# Patient Record
Sex: Male | Born: 1985 | Race: White | Hispanic: No | Marital: Married | Smoking: Never smoker
Health system: Southern US, Community
[De-identification: ages and names within clinical notes are randomized; demographics above are authoritative.]

## PROBLEM LIST (undated history)

## (undated) DIAGNOSIS — R42 Dizziness and giddiness: Secondary | ICD-10-CM

## (undated) DIAGNOSIS — F419 Anxiety disorder, unspecified: Secondary | ICD-10-CM

## (undated) DIAGNOSIS — E7800 Pure hypercholesterolemia, unspecified: Secondary | ICD-10-CM

## (undated) DIAGNOSIS — M549 Dorsalgia, unspecified: Secondary | ICD-10-CM

## (undated) DIAGNOSIS — K760 Fatty (change of) liver, not elsewhere classified: Secondary | ICD-10-CM

## (undated) HISTORY — PX: APPENDECTOMY: SHX54

---

## 2009-02-20 ENCOUNTER — Ambulatory Visit: Payer: Self-pay | Admitting: Family Medicine

## 2009-02-20 DIAGNOSIS — S335XXA Sprain of ligaments of lumbar spine, initial encounter: Secondary | ICD-10-CM

## 2009-02-20 DIAGNOSIS — M546 Pain in thoracic spine: Secondary | ICD-10-CM

## 2009-02-20 DIAGNOSIS — S20229A Contusion of unspecified back wall of thorax, initial encounter: Secondary | ICD-10-CM | POA: Insufficient documentation

## 2009-02-20 IMAGING — CR DG LUMBAR SPINE COMPLETE 4+V
7 series · 7 of 7 positions shown · non-contrast
Comparison: Thoracic spine films.

CLINICAL DATA: Status post fall.

LUMBAR SPINE - COMPLETE 4+ VIEW

[view not recorded (1 of 7)]
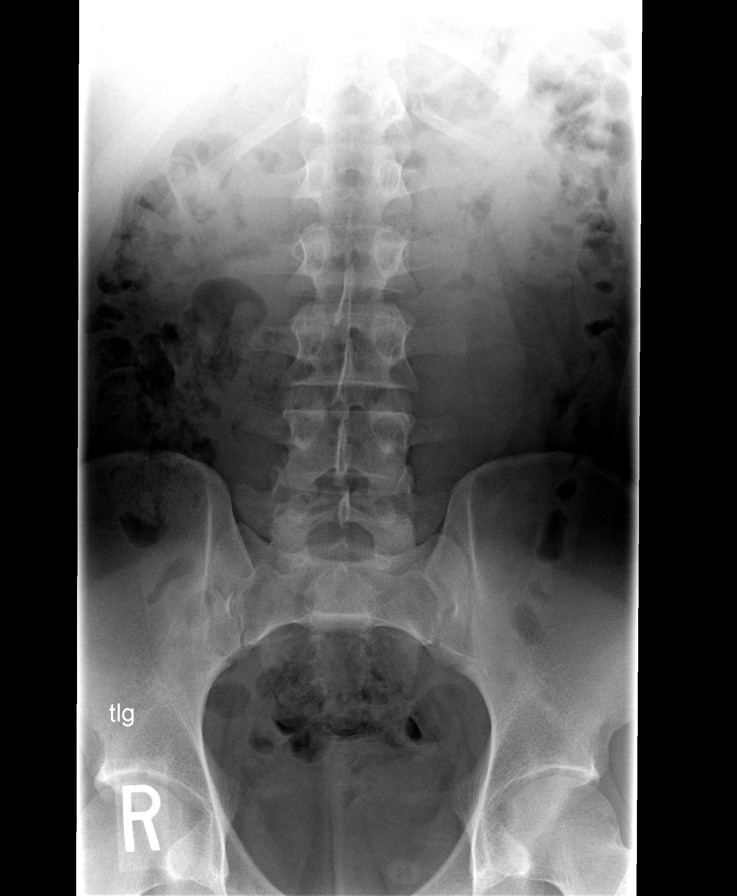

[view not recorded (2 of 7)]
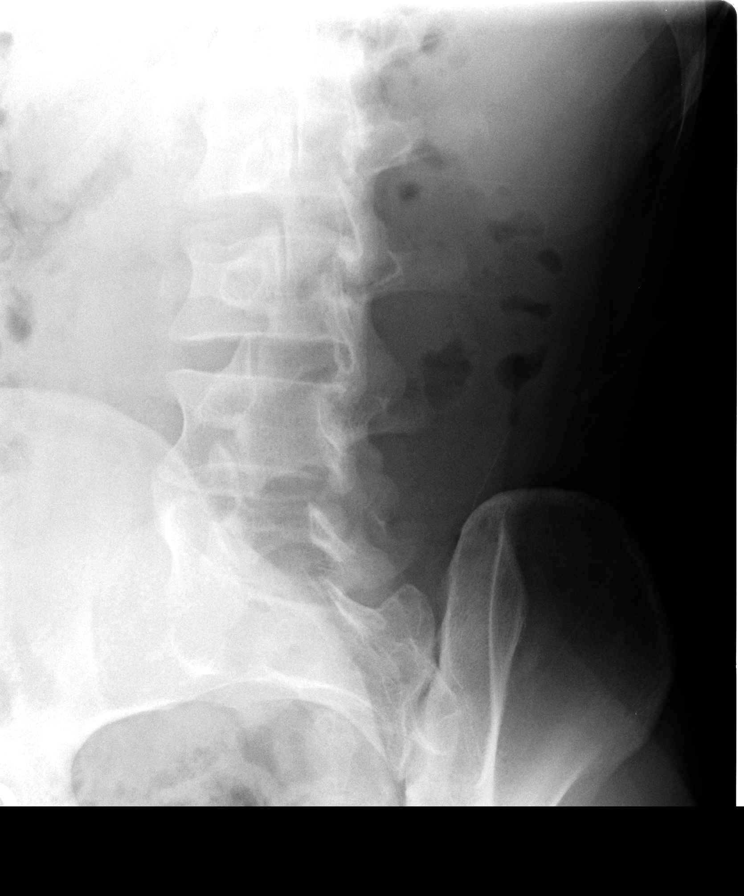

[view not recorded (3 of 7)]
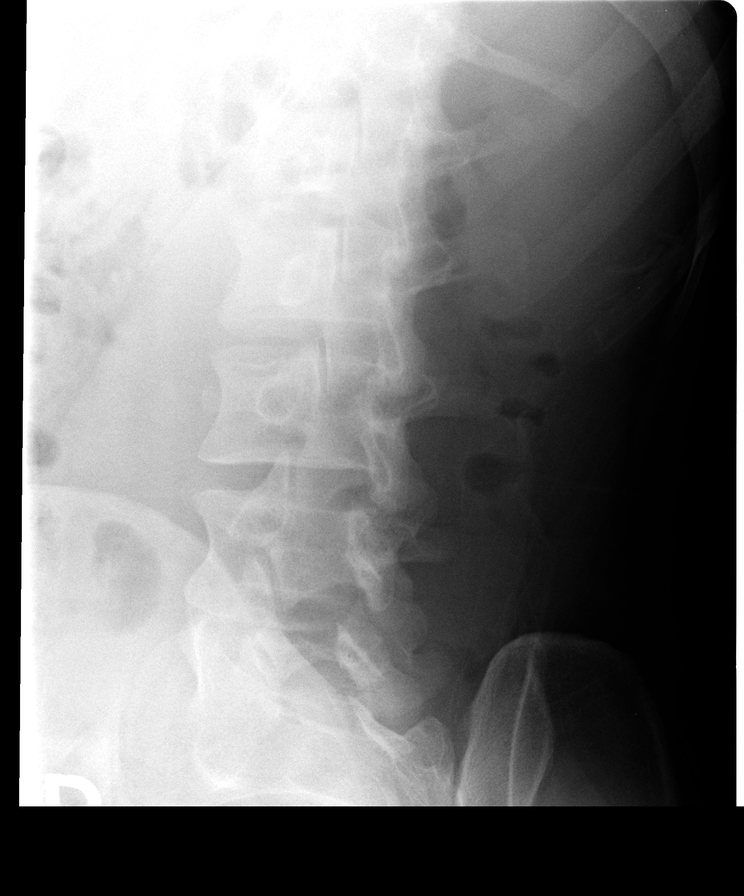

[view not recorded (4 of 7)]
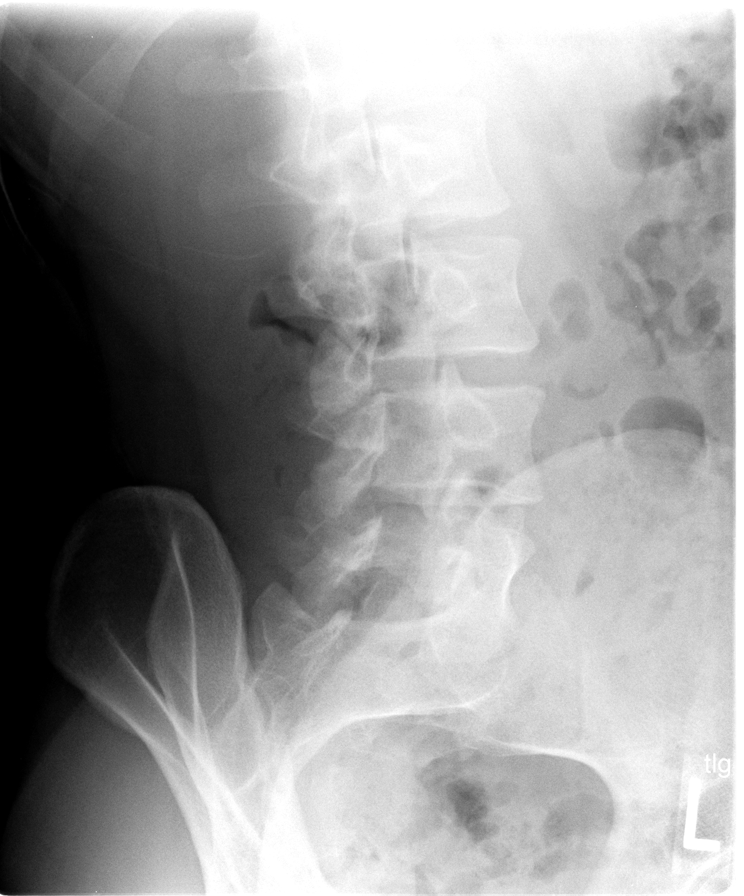

[view not recorded (5 of 7)]
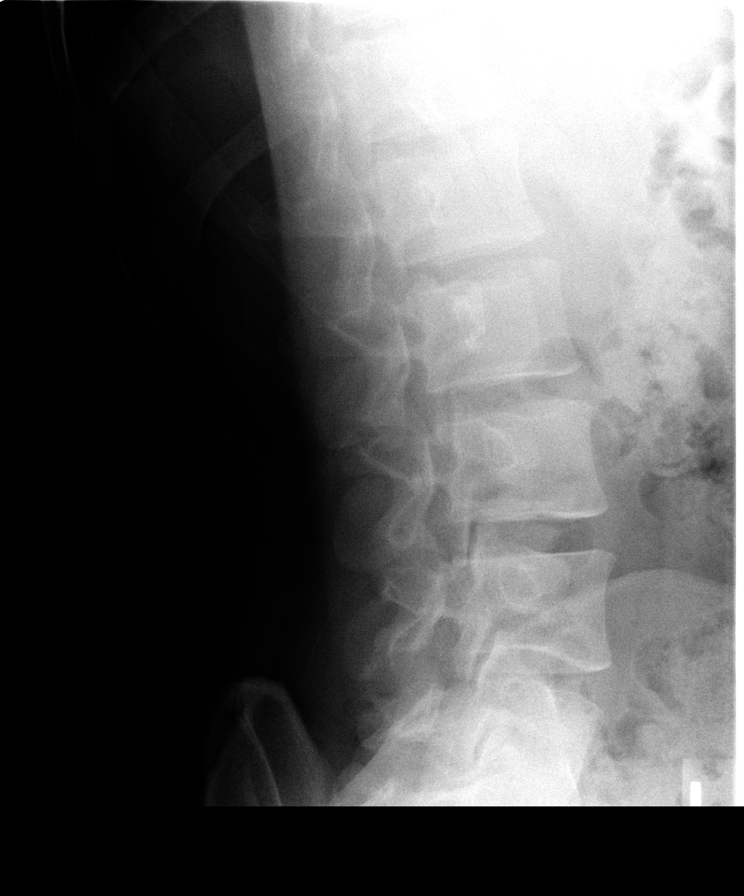

[view not recorded (6 of 7)]
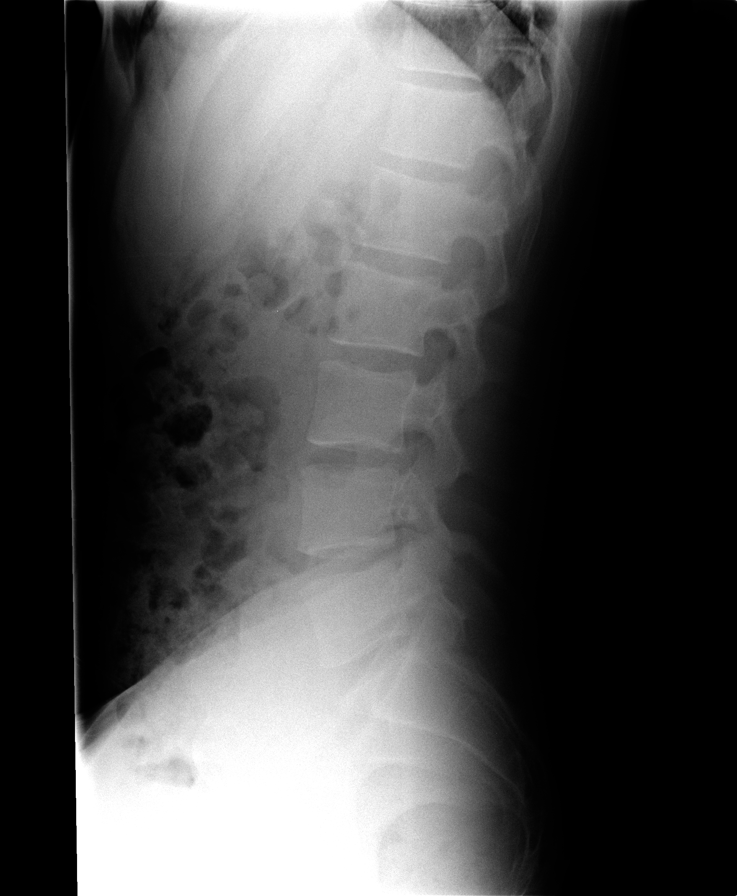

[view not recorded (7 of 7)]
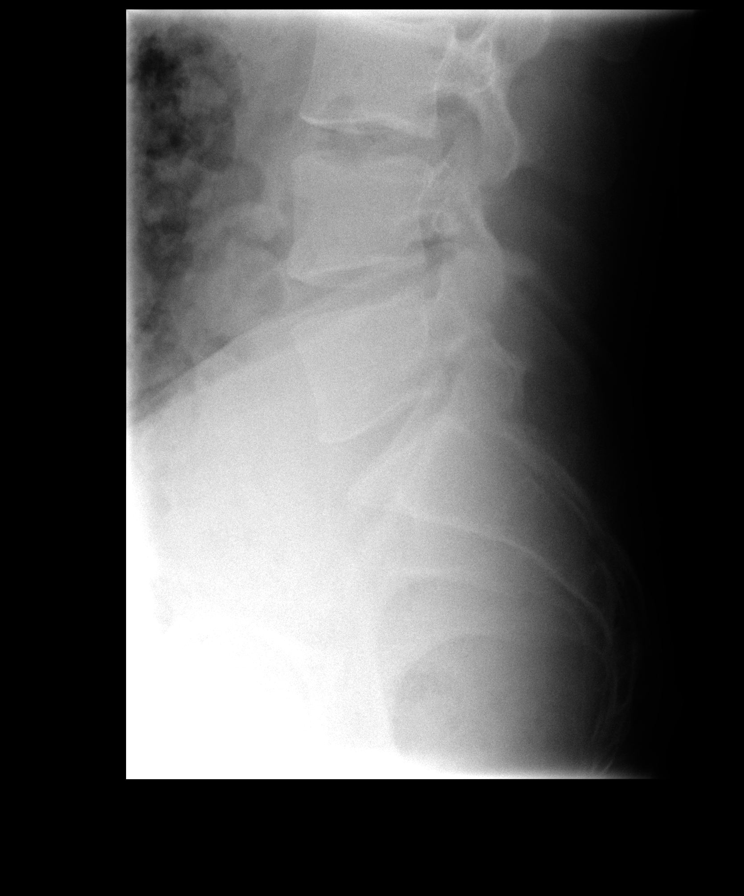

[7 of 7 positions shown; findings below may reference images not displayed]

FINDINGS: Five non-rib bearing lumbar type vertebral bodies are
present.  The vertebral body heights and alignment are maintained.
There is no evidence for acute fracture or traumatic subluxation.
IMPRESSION: Negative lumbar spine.

## 2009-02-20 IMAGING — CR DG THORACIC SPINE 2V
2 series · 2 of 2 positions shown · non-contrast
Comparison: None available.

CLINICAL DATA: Status post fall.  Back pain.

THORACIC SPINE - 2 VIEW

[view not recorded (1 of 2)]
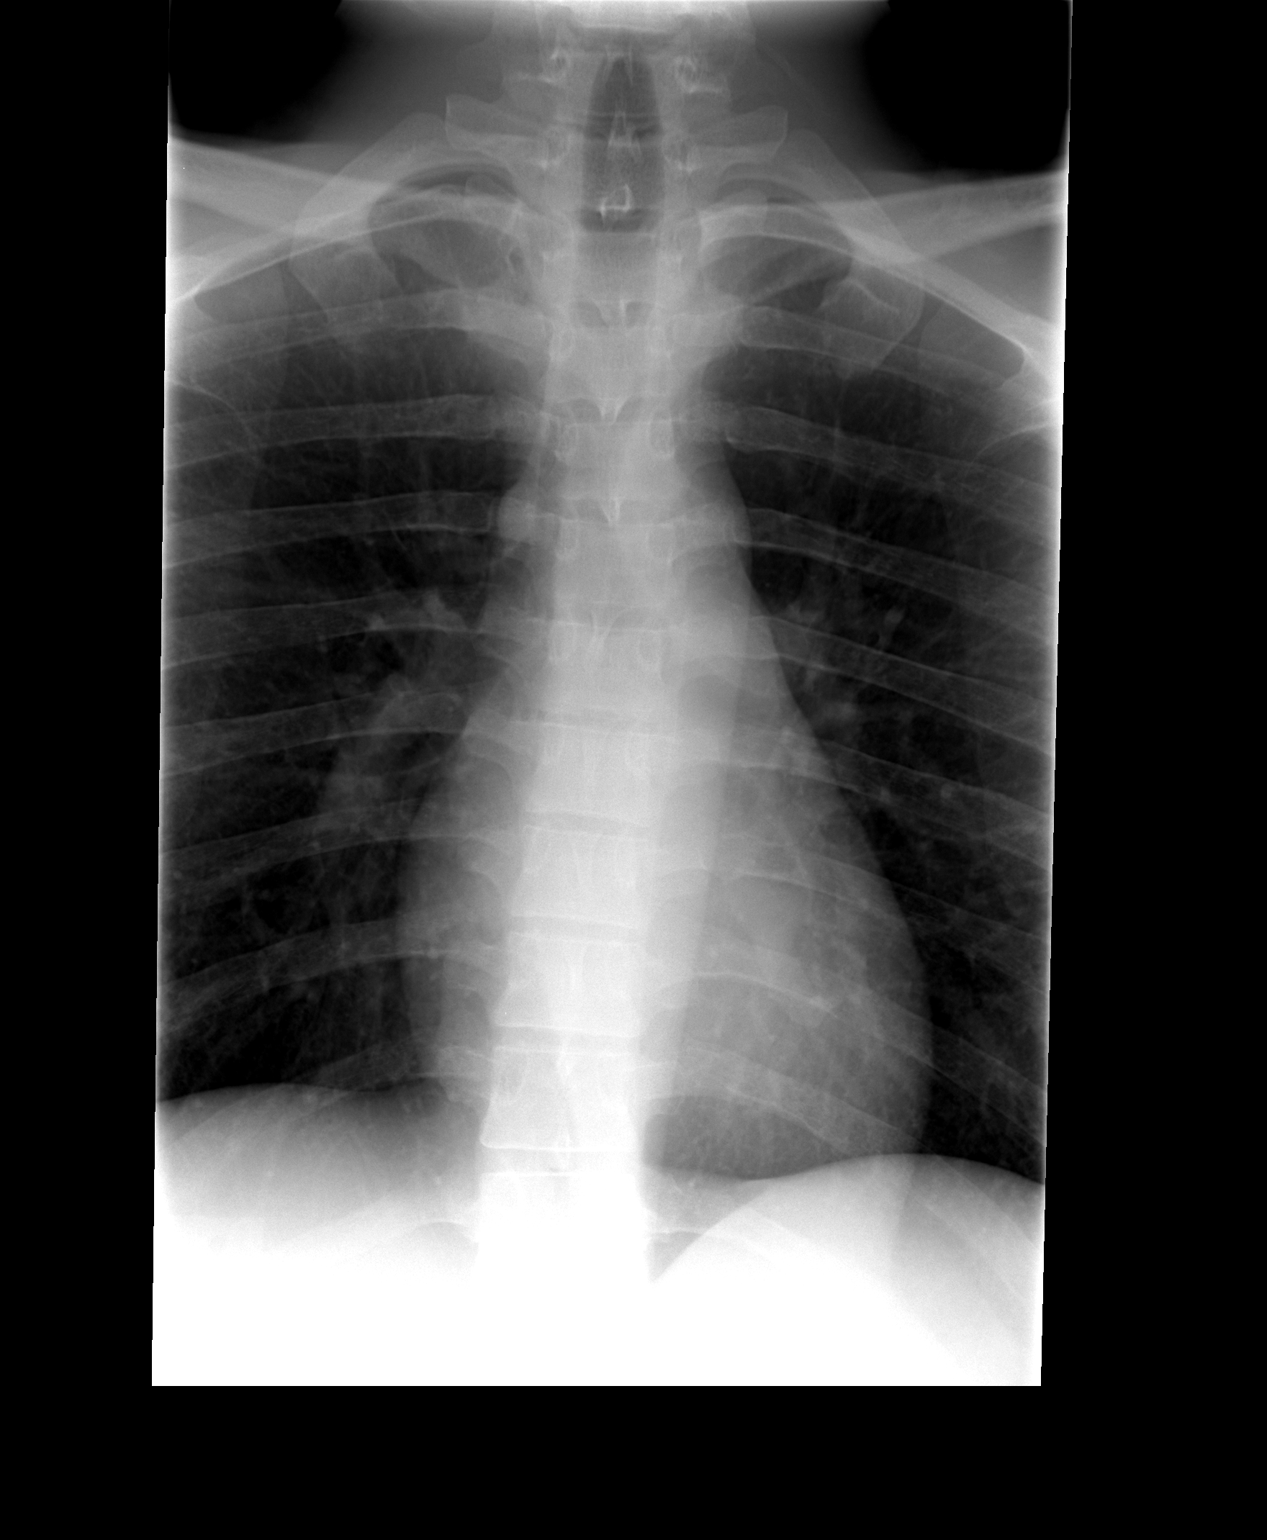

[view not recorded (2 of 2)]
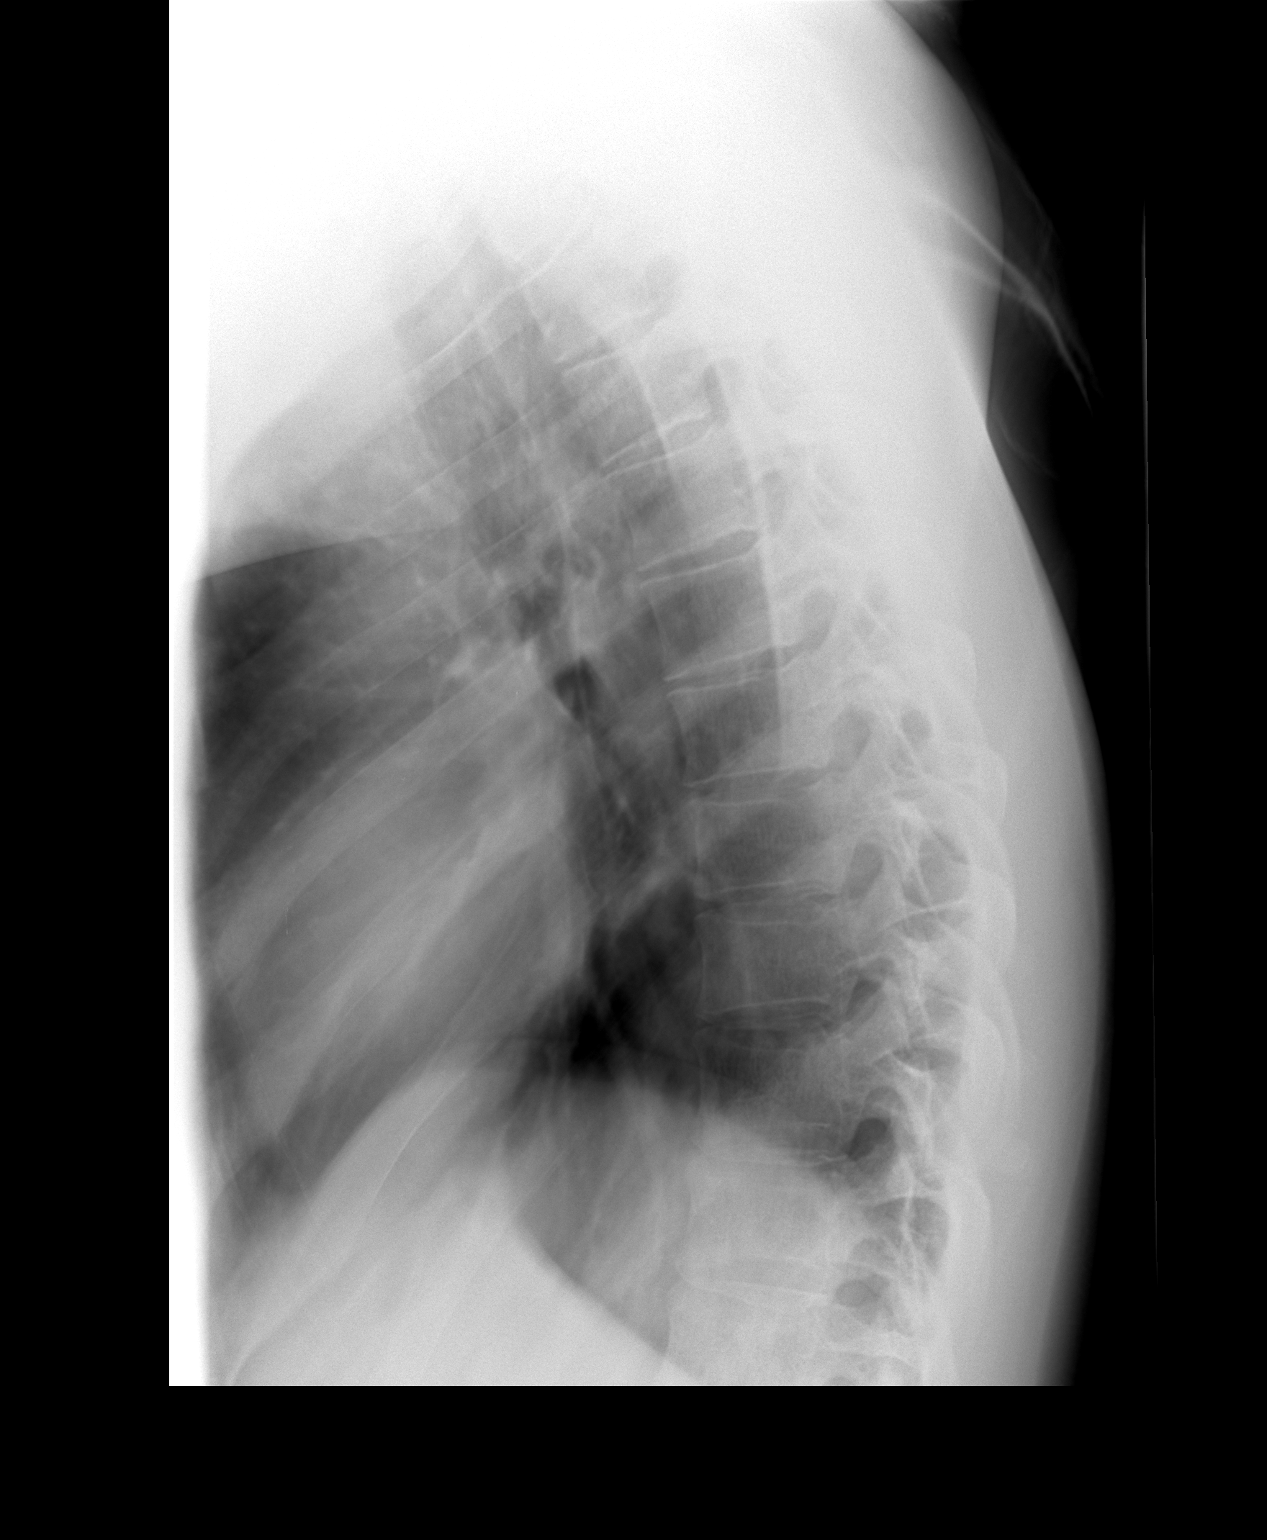

[2 of 2 positions shown; findings below may reference images not displayed]

FINDINGS: 12 rib-bearing thoracic type vertebral bodies are
present.  There is slight rightward curvature of the lower thoracic
spine centered at the T11 level.  The vertebral body heights are
maintained.  Alignment is anatomic.  There is no evidence for acute
fracture or traumatic subluxation.
IMPRESSION: 1.  No acute abnormality.
2.  Mild rightward curvature of the thoracolumbar spine centered at
T11.

## 2010-04-28 ENCOUNTER — Ambulatory Visit: Payer: Self-pay | Admitting: Family Medicine

## 2010-04-28 DIAGNOSIS — R110 Nausea: Secondary | ICD-10-CM

## 2010-04-28 DIAGNOSIS — R51 Headache: Secondary | ICD-10-CM

## 2010-04-28 DIAGNOSIS — R519 Headache, unspecified: Secondary | ICD-10-CM | POA: Insufficient documentation

## 2010-04-28 DIAGNOSIS — J029 Acute pharyngitis, unspecified: Secondary | ICD-10-CM | POA: Insufficient documentation

## 2010-11-26 NOTE — Letter (Signed)
Summary: Handout Printed  Printed Handout:  - Rheumatic Fever 

## 2010-11-26 NOTE — Assessment & Plan Note (Signed)
Summary: Sorethroat, x last night, HA, V/Dizzy x today rm 1   Vital Signs:  Patient Profile:   25 Years Old Male CC:      sorethroat x 1 dy, headaches, V/D xtoday Height:     68 inches Weight:      171 pounds O2 Sat:      98 % O2 treatment:    Room Air Temp:     100.5 degrees F oral Pulse rate:   88 / minute Pulse rhythm:   regular Resp:     16 per minute BP sitting:   125 / 73  (right arm) Cuff size:   regular  Vitals Entered By: Areta Haber CMA (April 28, 2010 2:50 PM)                  Current Allergies: No known allergies History of Present Illness Chief Complaint: sorethroat x 1 dy, headaches, V/D xtoday History of Present Illness:  Subjective: Patient complains of sore throat for 2 days, associated with intermittent dizziness and nausea.  His wife is being treated for a documented strep pharyngitis. No cough No pleuritic pain No wheezing No nasal congestion No post-nasal drainage No sinus pain/pressure No itchy/red eyes ? left  earache No hemoptysis No SOB + fever/chills No vomiting No abdominal pain No diarrhea No skin rashes + fatigue No myalgias No headache    Current Problems: NAUSEA ALONE (ICD-787.02) ACUTE PHARYNGITIS (ICD-462) HEADACHE (ICD-784.0) CONTUSION OF BACK (ICD-922.31) BACK PAIN, THORACIC REGION (ICD-724.1) LUMBAR STRAIN (ICD-847.2)   Current Meds IMITREX STATDOSE SYSTEM 6 MG/0.5ML KIT (SUMATRIPTAN SUCCINATE) as needed for headaches. No more than 2 injections per day VERAPAMIL HCL 120 MG TABS (VERAPAMIL HCL) 1 tab by mouth x three times a day TYLENOL 325 MG TABS (ACETAMINOPHEN) as needed AZITHROMYCIN 250 MG TABS (AZITHROMYCIN) Two tabs by mouth on day 1, then 1 tab daily on days 2 through 5 ZOFRAN 4 MG TABS (ONDANSETRON HCL) One or two tabs by mouth two times a day as needed for nausea  REVIEW OF SYSTEMS Constitutional Symptoms       Complains of fever.     Denies chills, night sweats, weight loss, weight gain, and  fatigue.  Eyes       Denies change in vision, eye pain, eye discharge, glasses, contact lenses, and eye surgery. Ear/Nose/Throat/Mouth       Complains of ear pain and sore throat.      Denies hearing loss/aids, change in hearing, ear discharge, dizziness, frequent runny nose, frequent nose bleeds, sinus problems, hoarseness, and tooth pain or bleeding.      Comments: L - x last night Respiratory       Denies dry cough, productive cough, wheezing, shortness of breath, asthma, bronchitis, and emphysema/COPD.  Cardiovascular       Denies murmurs, chest pain, and tires easily with exhertion.    Gastrointestinal       Complains of nausea/vomiting.      Denies stomach pain, diarrhea, constipation, blood in bowel movements, and indigestion.      Comments: dizzy x today Genitourniary       Denies painful urination, kidney stones, and loss of urinary control. Neurological       Complains of headaches.      Denies paralysis, seizures, and fainting/blackouts. Musculoskeletal       Denies muscle pain, joint pain, joint stiffness, decreased range of motion, redness, swelling, muscle weakness, and gout.  Skin       Denies bruising, unusual  mles/lumps or sores, and hair/skin or nail changes.  Psych       Denies mood changes, temper/anger issues, anxiety/stress, speech problems, depression, and sleep problems. Other Comments: Pt has not taken anything for HA.    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  Extraocular movement is intact.  Conjunctivae are not inflamed.  Fundi benign Ears:  Canals normal.  Tympanic membranes normal.   Nose:  Not congested Pharynx:  Erythematous without exudate Neck:  Supple.  Tender shotty anterior nodes are palpated bilaterally.  Lungs:  Clear to auscultation.  Breath sounds are equal.  Heart:  Regular rate and rhythm without murmurs, rubs, or gallops.  Abdomen:  Nontender without masses or hepatosplenomegaly.  Bowel sounds are present.  No CVA or flank tenderness.  Assessment New Problems: NAUSEA ALONE (ICD-787.02) ACUTE PHARYNGITIS (ICD-462) HEADACHE (ICD-784.0)  WILL TREAT EMPIRICALLY FOR STREP SINCE WIFE HAS STREP INFECTION  Plan New Medications/Changes: ZOFRAN 4 MG TABS (ONDANSETRON HCL) One or two tabs by mouth two times a day as needed for nausea  #Ten (10) x 0, 04/28/2010, Donna Christen MD AZITHROMYCIN 250 MG TABS (AZITHROMYCIN) Two tabs by mouth on day 1, then 1 tab daily on days 2 through 5  #6 tabs x 0, 04/28/2010, Donna Christen MD  New Orders: Est. Patient Level III [phone] Planning Comments:   Begin Z-pack.  Zofran for nausea.  Clear liquids today, then advance diet when improved. Follow-up with PCP if not improving.   The patient and/or caregiver has been counseled thoroughly with regard to medications prescribed including dosage, schedule, interactions, rationale for use, and possible side effects and they verbalize understanding.  Diagnoses and expected course of recovery discussed and will return if not improved as expected or if the condition worsens. Patient and/or caregiver verbalized understanding.  Prescriptions: ZOFRAN 4 MG TABS (ONDANSETRON HCL) One or two tabs by mouth two times a day as needed for nausea  #Ten (10) x 0   Entered and Authorized by:   Donna Christen MD   Signed by:   Donna Christen MD on 04/28/2010   Method used:   Print then Give to Patient   RxID:    [id number] AZITHROMYCIN 250 MG TABS (AZITHROMYCIN) Two tabs by mouth on day 1, then 1 tab daily on days 2 through 5  #6 tabs x 0   Entered and Authorized by:   Donna Christen MD   Signed by:   Donna Christen MD on 04/28/2010   Method used:   Print then Give to Patient   RxID:   [id number]   Orders Added: 1)  Est. Patient Level III [29528]

## 2018-05-11 ENCOUNTER — Emergency Department

## 2018-05-11 ENCOUNTER — Ambulatory Visit (INDEPENDENT_AMBULATORY_CARE_PROVIDER_SITE_OTHER)
Admission: EM | Admit: 2018-05-11 | Discharge: 2018-05-11 | Disposition: A | Discharge status: Other Acute Inpatient Hospital | Source: Home / Self Care | Attending: Family Medicine | Admitting: Family Medicine

## 2018-05-11 ENCOUNTER — Observation Stay
Admission: EM | Admit: 2018-05-11 | Discharge: 2018-05-12 | Disposition: A | Discharge status: Home / Self Care | Attending: Specialist | Admitting: Specialist

## 2018-05-11 ENCOUNTER — Encounter: Payer: Self-pay | Admitting: *Deleted

## 2018-05-11 ENCOUNTER — Encounter: Payer: Self-pay | Admitting: Emergency Medicine

## 2018-05-11 ENCOUNTER — Other Ambulatory Visit: Payer: Self-pay

## 2018-05-11 DIAGNOSIS — E785 Hyperlipidemia, unspecified: Secondary | ICD-10-CM | POA: Diagnosis not present

## 2018-05-11 DIAGNOSIS — R0602 Shortness of breath: Secondary | ICD-10-CM

## 2018-05-11 DIAGNOSIS — R079 Chest pain, unspecified: Secondary | ICD-10-CM

## 2018-05-11 DIAGNOSIS — F419 Anxiety disorder, unspecified: Secondary | ICD-10-CM | POA: Insufficient documentation

## 2018-05-11 DIAGNOSIS — R0789 Other chest pain: Principal | ICD-10-CM | POA: Insufficient documentation

## 2018-05-11 DIAGNOSIS — Z8379 Family history of other diseases of the digestive system: Secondary | ICD-10-CM | POA: Diagnosis not present

## 2018-05-11 DIAGNOSIS — G8929 Other chronic pain: Secondary | ICD-10-CM | POA: Insufficient documentation

## 2018-05-11 DIAGNOSIS — Z9889 Other specified postprocedural states: Secondary | ICD-10-CM | POA: Insufficient documentation

## 2018-05-11 DIAGNOSIS — M5489 Other dorsalgia: Secondary | ICD-10-CM | POA: Diagnosis not present

## 2018-05-11 DIAGNOSIS — R42 Dizziness and giddiness: Secondary | ICD-10-CM | POA: Insufficient documentation

## 2018-05-11 DIAGNOSIS — Z79899 Other long term (current) drug therapy: Secondary | ICD-10-CM | POA: Diagnosis not present

## 2018-05-11 DIAGNOSIS — K760 Fatty (change of) liver, not elsewhere classified: Secondary | ICD-10-CM | POA: Insufficient documentation

## 2018-05-11 DIAGNOSIS — R918 Other nonspecific abnormal finding of lung field: Secondary | ICD-10-CM | POA: Diagnosis not present

## 2018-05-11 DIAGNOSIS — F319 Bipolar disorder, unspecified: Secondary | ICD-10-CM | POA: Insufficient documentation

## 2018-05-11 DIAGNOSIS — M549 Dorsalgia, unspecified: Secondary | ICD-10-CM | POA: Diagnosis not present

## 2018-05-11 HISTORY — DX: Anxiety disorder, unspecified: F41.9

## 2018-05-11 HISTORY — DX: Pure hypercholesterolemia, unspecified: E78.00

## 2018-05-11 HISTORY — DX: Dizziness and giddiness: R42

## 2018-05-11 HISTORY — DX: Fatty (change of) liver, not elsewhere classified: K76.0

## 2018-05-11 HISTORY — DX: Dorsalgia, unspecified: M54.9

## 2018-05-11 LAB — BASIC METABOLIC PANEL
ANION GAP: 9 (ref 5–15)
BUN: 14 mg/dL (ref 6–20)
CHLORIDE: 100 mmol/L (ref 98–111)
CO2: 28 mmol/L (ref 22–32)
CREATININE: 1.07 mg/dL (ref 0.61–1.24)
Calcium: 10.2 mg/dL (ref 8.9–10.3)
GFR calc Af Amer: >60 mL/min (ref >60)
GFR calc non Af Amer: >60 mL/min (ref >60)
Glucose, Bld: 101 mg/dL — ABNORMAL HIGH (ref 70–99)
Potassium: 3.8 mmol/L (ref 3.5–5.1)
SODIUM: 137 mmol/L (ref 135–145)

## 2018-05-11 LAB — CBC
HCT: 42.1 % (ref 40.0–52.0)
HEMOGLOBIN: 14.8 g/dL (ref 13.0–18.0)
MCH: 31.5 pg (ref 26.0–34.0)
MCHC: 35.1 g/dL (ref 32.0–36.0)
MCV: 89.8 fL (ref 80.0–100.0)
PLATELETS: 262 10*3/uL (ref 150–440)
RBC: 4.68 MIL/uL (ref 4.40–5.90)
RDW: 13.5 % (ref 11.5–14.5)
WBC: 9.4 10*3/uL (ref 3.8–10.6)

## 2018-05-11 LAB — TROPONIN I: Troponin I: <0.03 ng/mL (ref <0.03)

## 2018-05-11 IMAGING — CR DG CHEST 2V
2 series · 2 of 2 positions shown · non-contrast
Comparison: None.

CLINICAL DATA: Chest pain

EXAM:
CHEST - 2 VIEW

[chest pa]
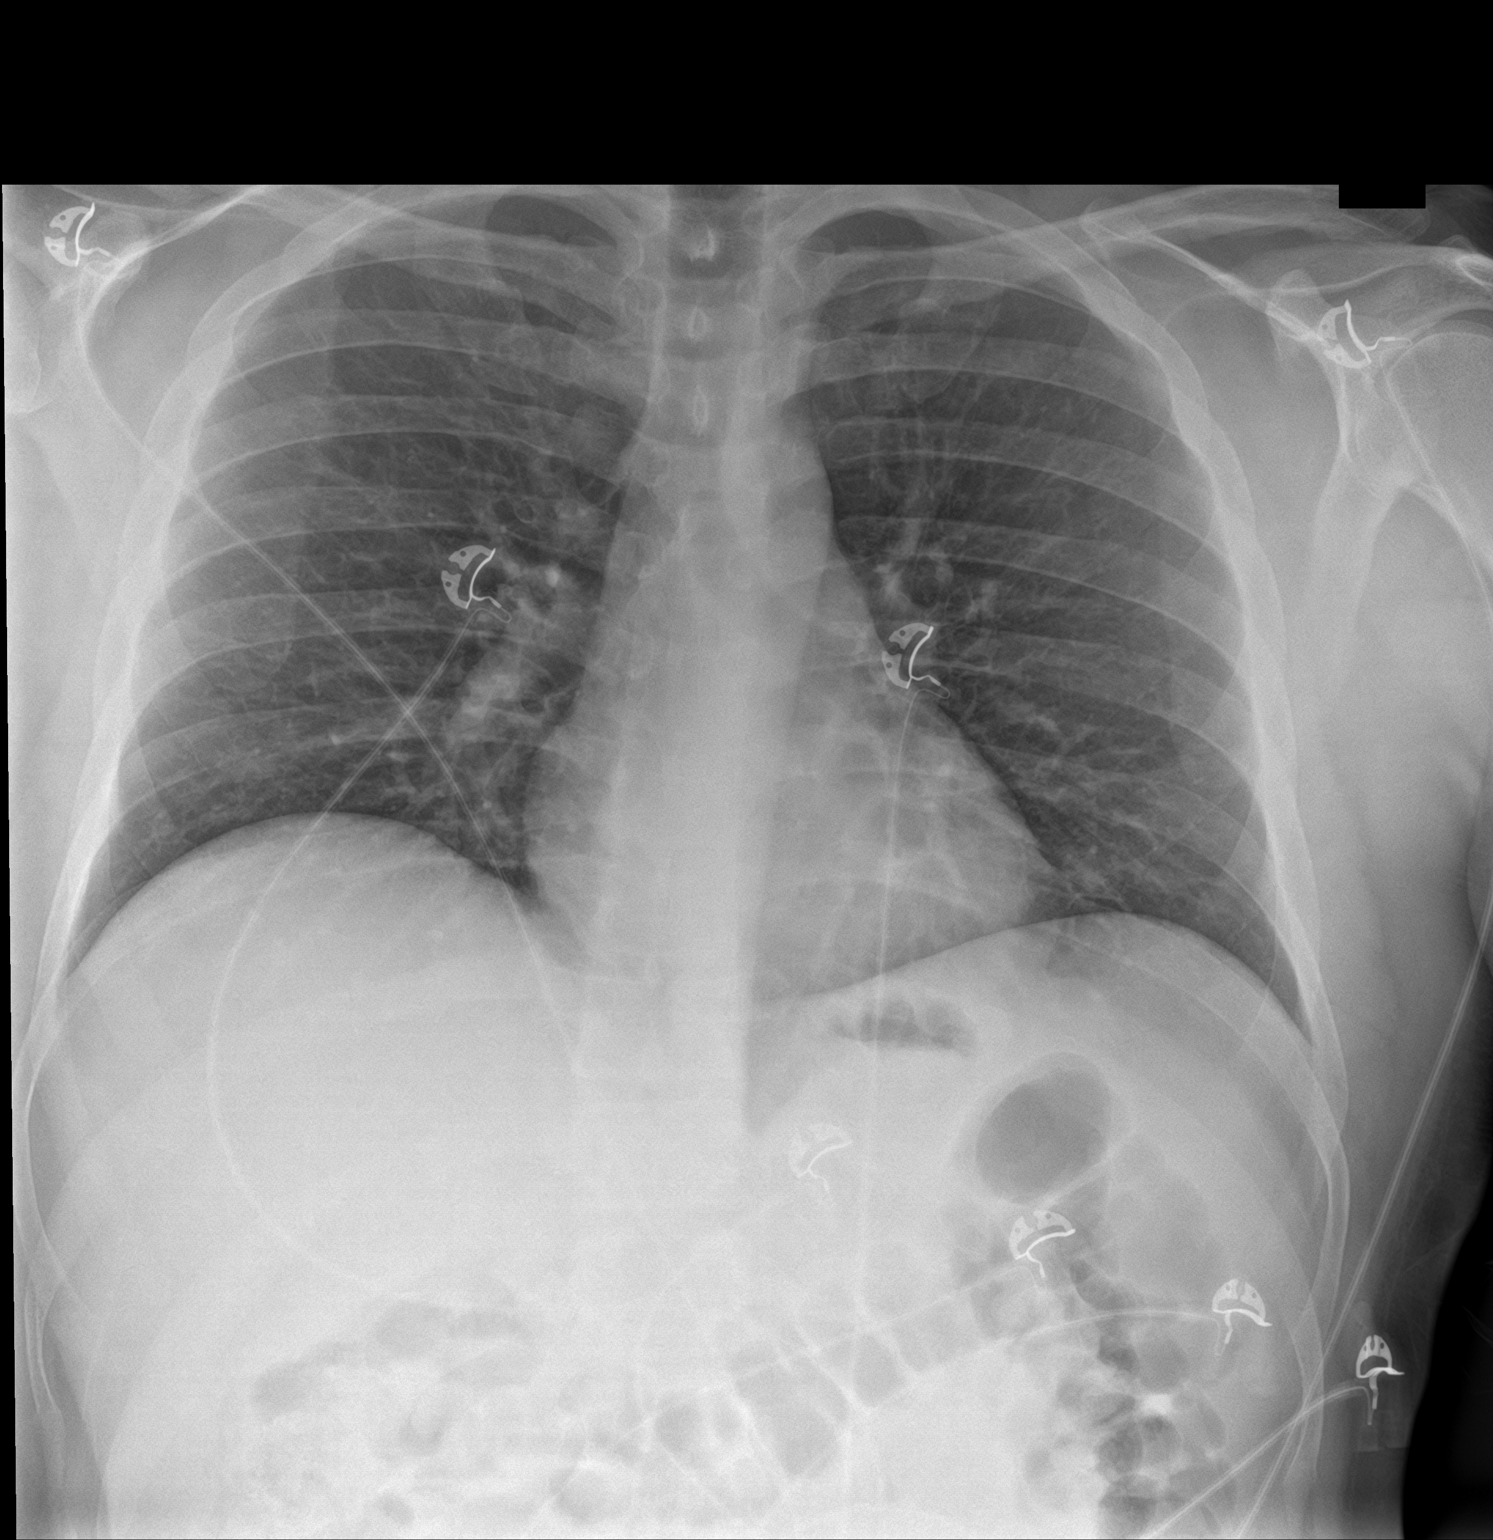

[chest lat]
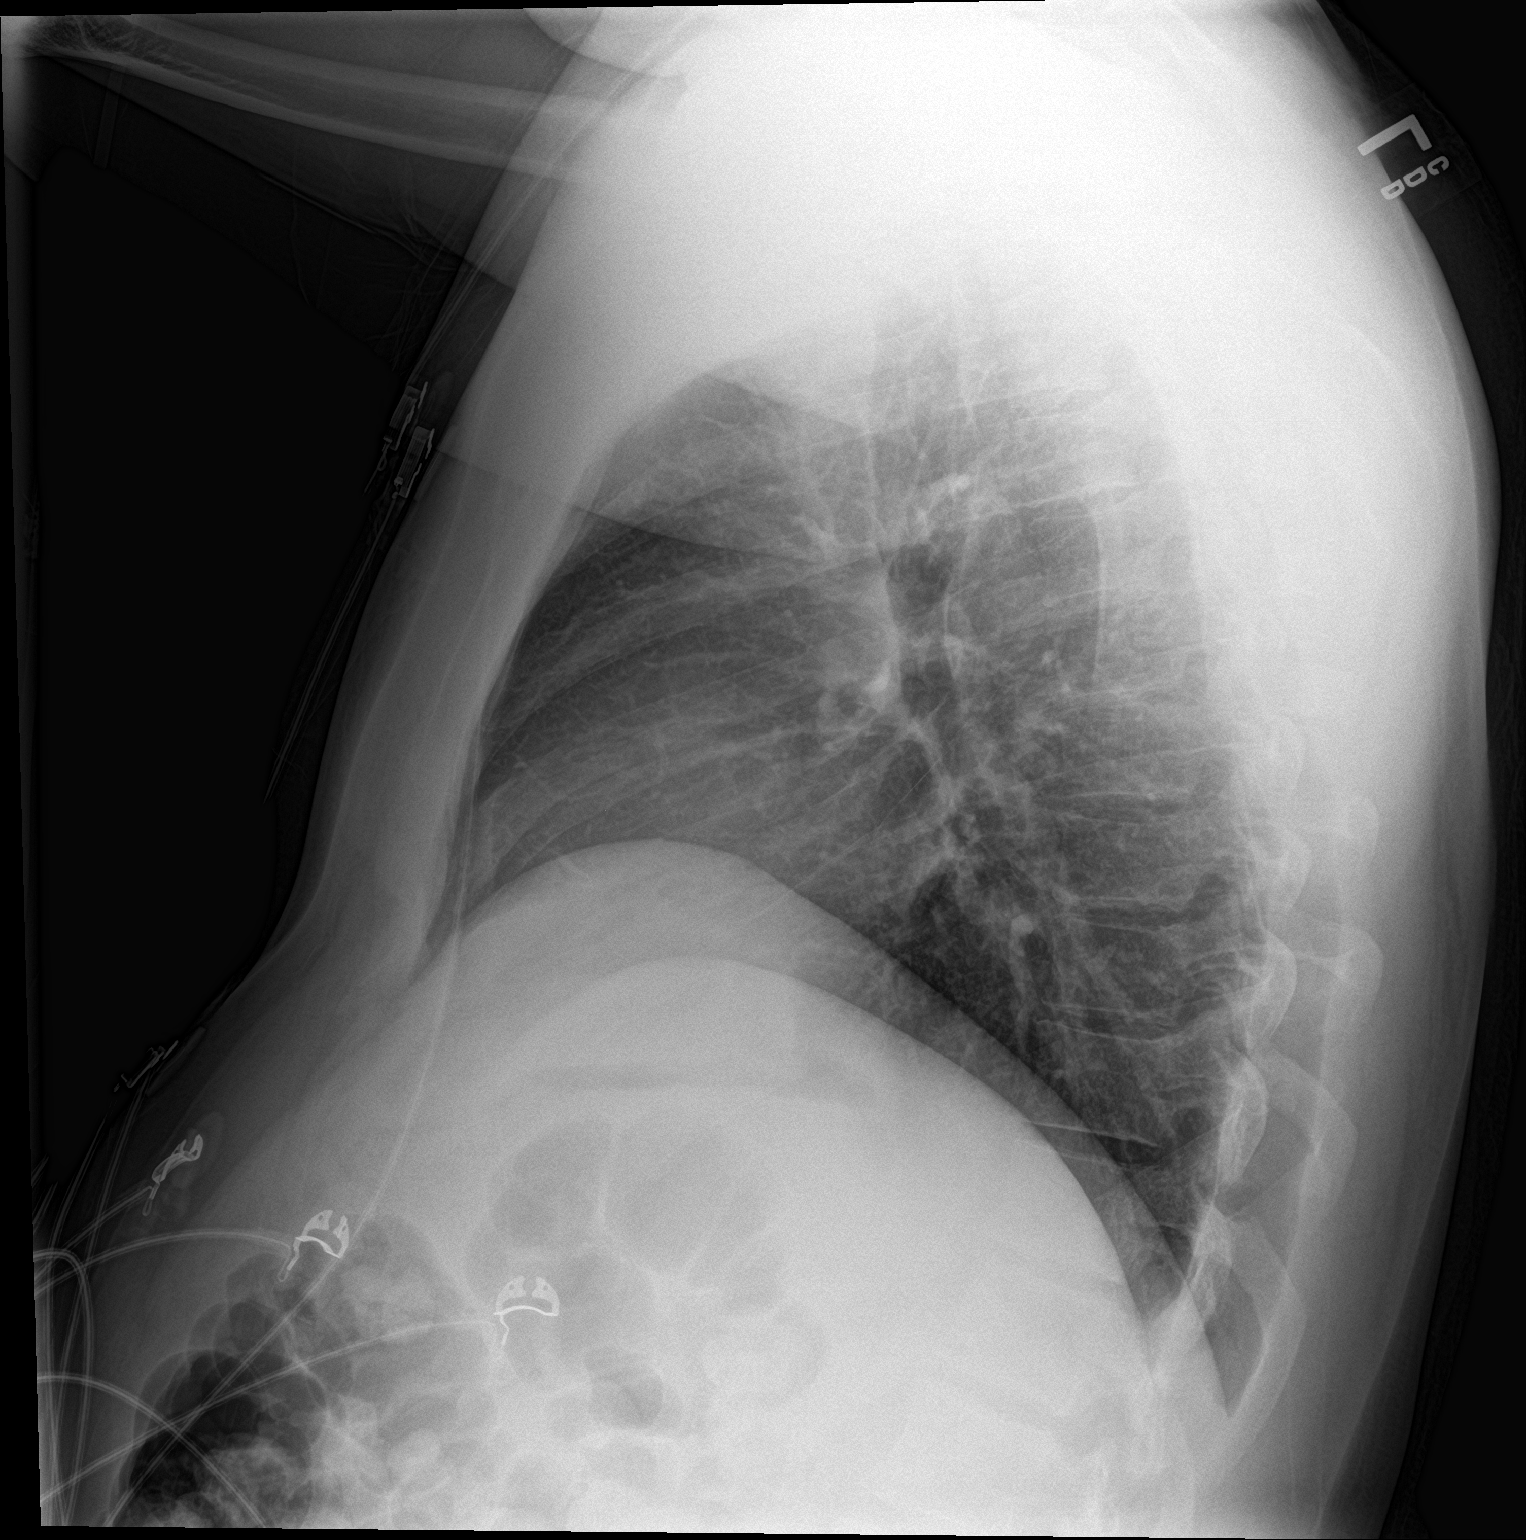

[2 of 2 positions shown; findings below may reference images not displayed]

FINDINGS: Lungs are clear. Heart size and pulmonary vascularity are normal. No
adenopathy. No pneumothorax. No bone lesions.
IMPRESSION: No edema or consolidation.

## 2018-05-11 IMAGING — CT CT ANGIO CHEST
3 of 7 series · 18 of 46 positions shown · IV contrast (APPLIED)
Comparison: Chest radiograph [DATE]

CLINICAL DATA: 31-year-old with chest pain and upper back pain.
Concern for acute aortic syndrome.

EXAM:
CT ANGIOGRAPHY CHEST WITH CONTRAST
TECHNIQUE: Multidetector CT imaging of the chest was performed using the
standard protocol during bolus administration of intravenous
contrast. Multiplanar CT image reconstructions and MIPs were
obtained to evaluate the vascular anatomy.
CONTRAST:  100mL [16] IOPAMIDOL ([16]) INJECTION 76%

[Series 6: axial arterial · axial · arterial · 0.72mm/px · z∈[-333,-93]mm · 8 of 104 slices shown]
[im 12/104  lung]
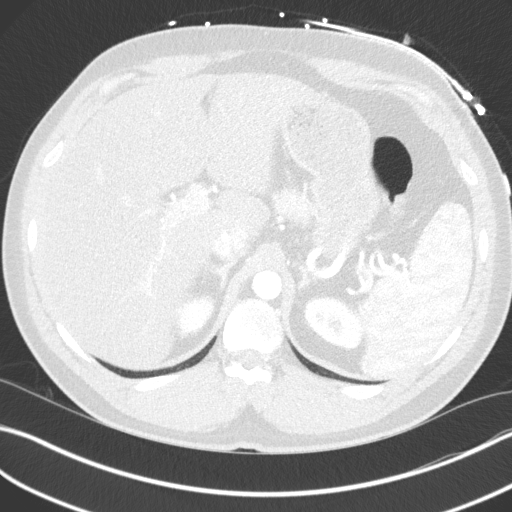
[im 23/104  soft-tissue]
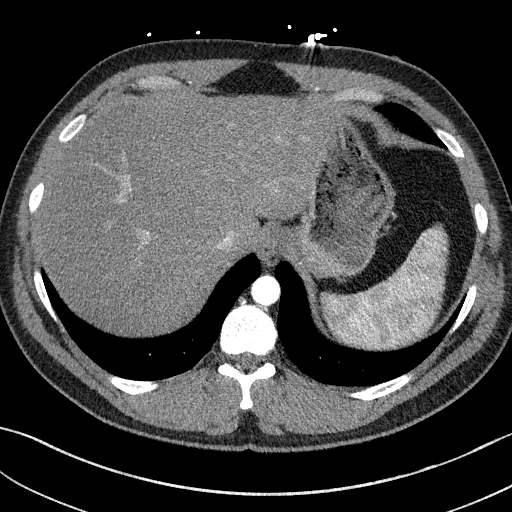
[im 35/104  lung]
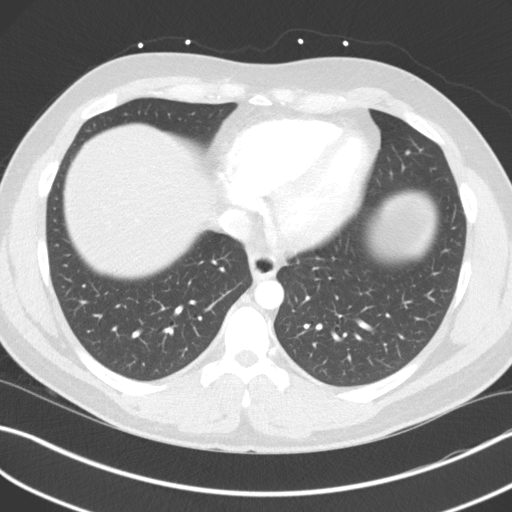
[im 46/104  soft-tissue]
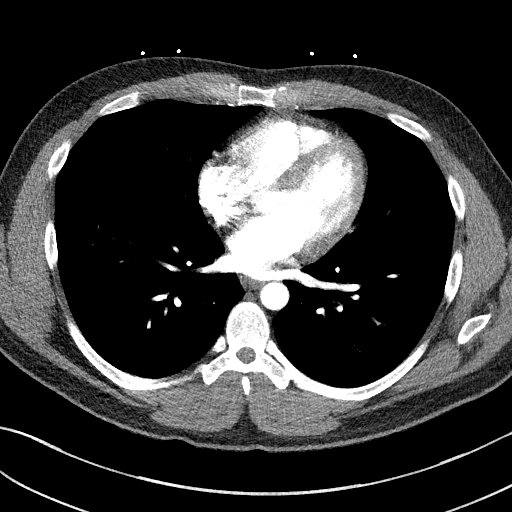
[im 58/104  lung]
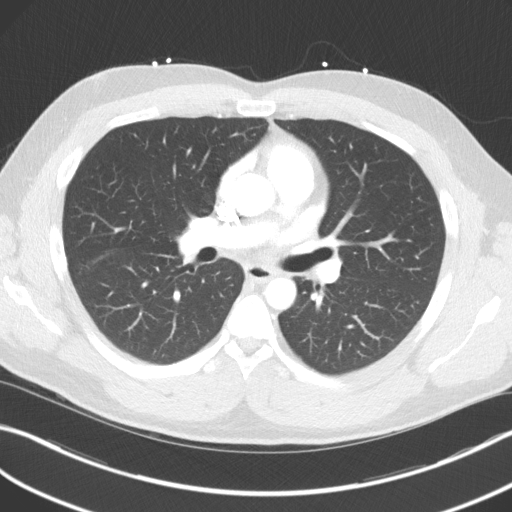
[im 69/104  soft-tissue]
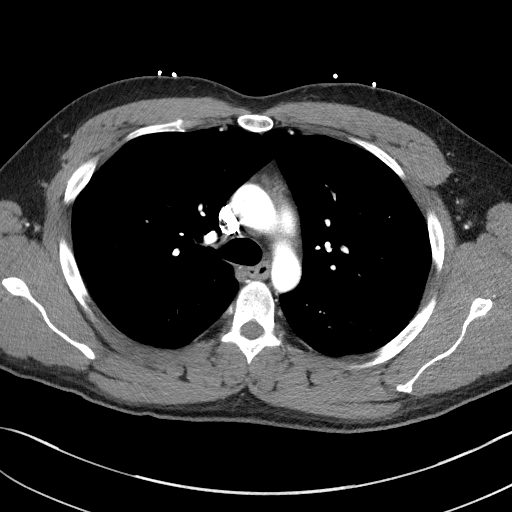
[im 81/104  lung]
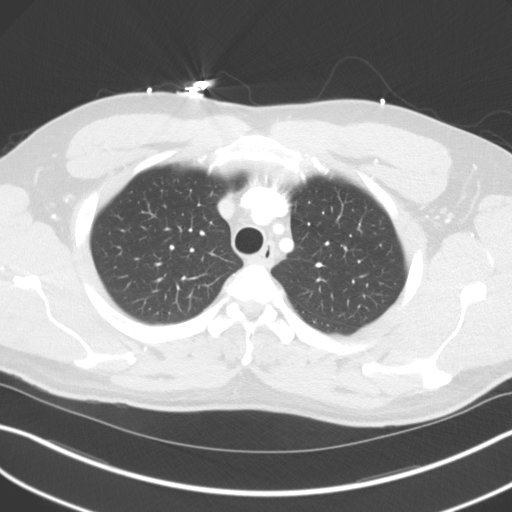
[im 92/104  soft-tissue]
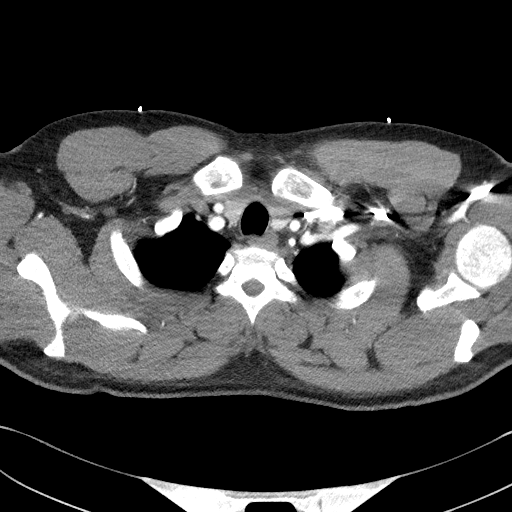

[Series 7: lung · axial · 0.72mm/px · z∈[-347,-119]mm · 7 of 156 slices shown]
[im 11/156  soft-tissue]
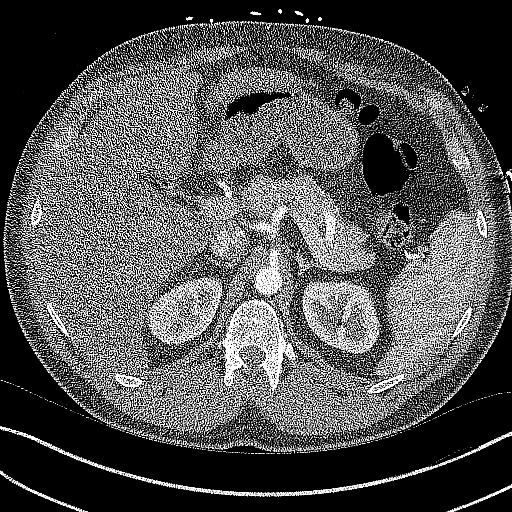
[im 32/156  soft-tissue]
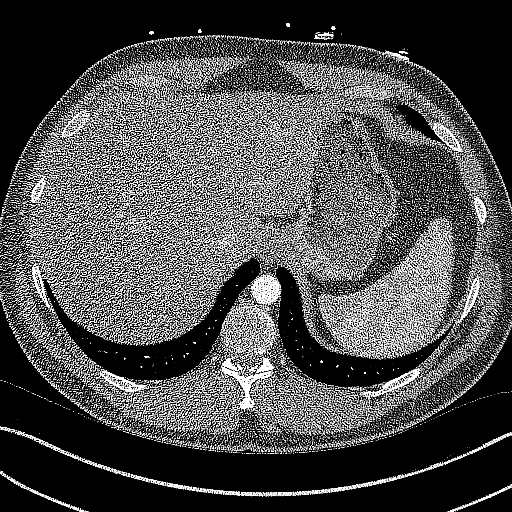
[im 52/156  soft-tissue]
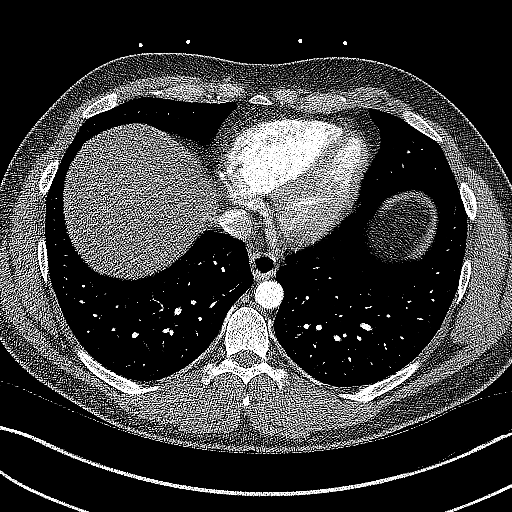
[im 73/156  soft-tissue]
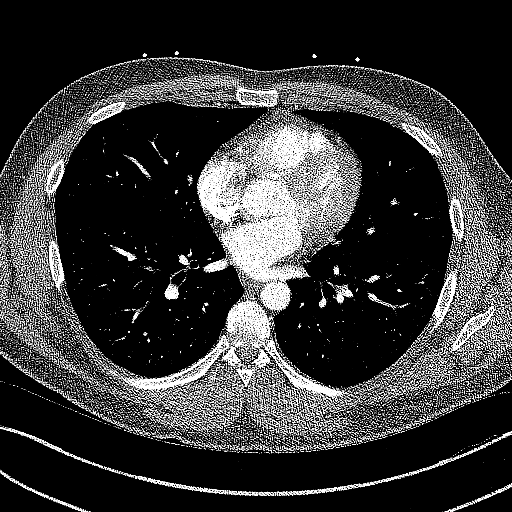
[im 83/156  soft-tissue]
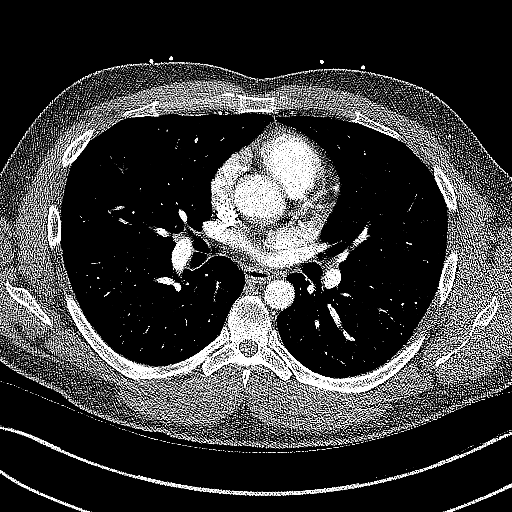
[im 104/156  soft-tissue]
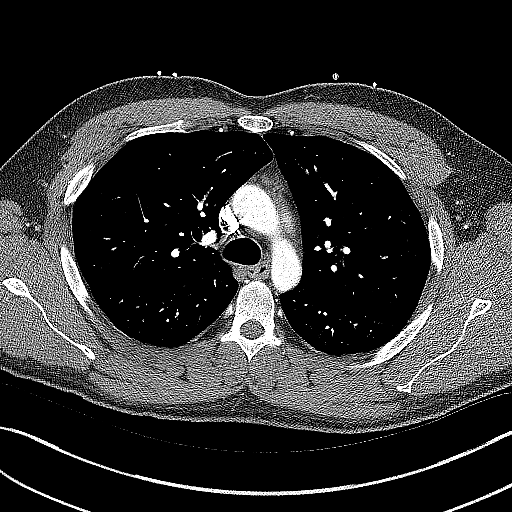
[im 125/156  soft-tissue]
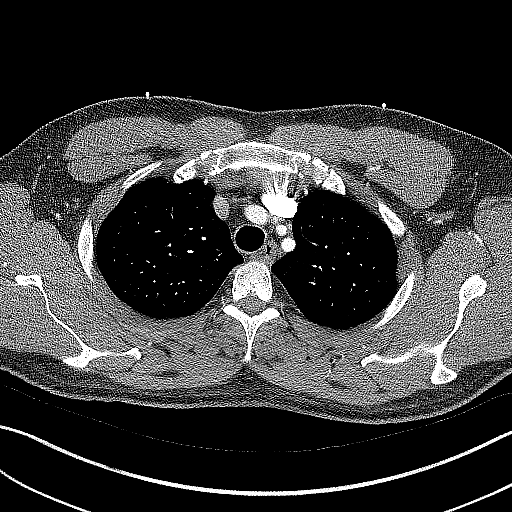

[Series 9: coronals · coronal · 0.63mm/px · 3 of 135 slices shown]
[im 34/135  soft-tissue]
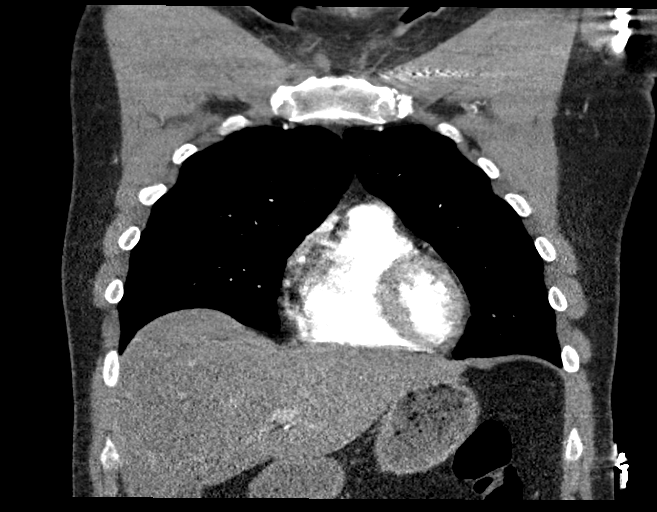
[im 68/135  soft-tissue]
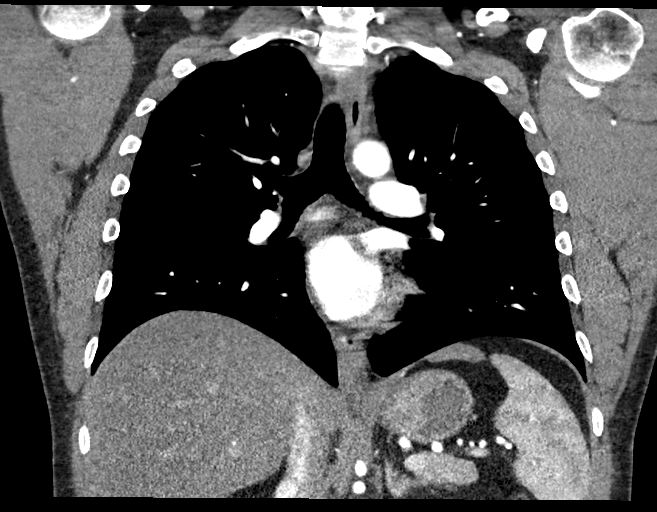
[im 101/135  soft-tissue]
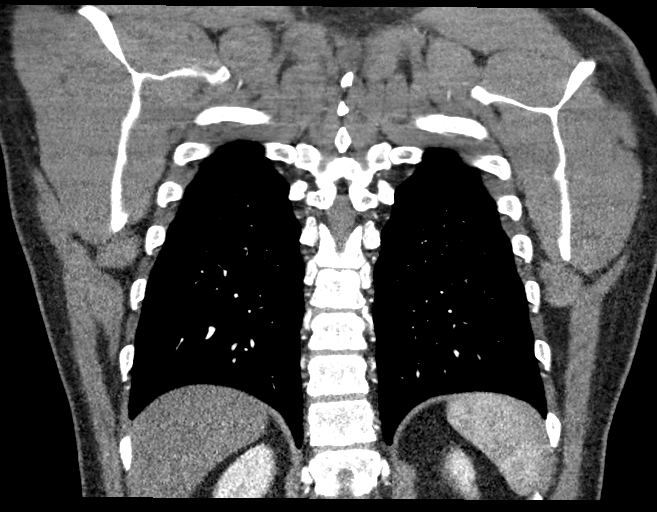

[18 of 46 positions shown; findings below may reference images not displayed]

FINDINGS: Cardiovascular: Normal caliber of the thoracic aorta without
evidence for dissection or intramural hematoma. Typical aortic arch
anatomy and the proximal great vessels are patent. No evidence for
central or large pulmonary embolism. Heart size is normal without
significant pericardial fluid.

Mediastinum/Nodes: Mild asymmetry of the thyroid tissue, right side
greater than left. Evidence for thymic tissue. Esophagus is
unremarkable. No mediastinal or hilar lymphadenopathy.

Lungs/Pleura: Trachea and mainstem bronchi are patent. No pleural
effusions. Lungs are clear without airspace disease or lung
consolidation. Tiny pleural-based nodule along the right minor
fissure on sequence 7, image 81. There is another pleural-based
nodule along the right major fissure on sequence 7, image 76
measuring up to 5 mm. These nodule are likely incidental findings in
a patient of this age.

Upper Abdomen: Low-attenuation the liver with evidence of fatty
sparing adjacent to the gallbladder.

Musculoskeletal: No acute abnormality.

Review of the MIP images confirms the above findings.
IMPRESSION: No acute chest abnormality. No evidence for an aortic dissection or
aneurysm. No evidence for a large or central pulmonary embolism.

Hepatic steatosis.

Two small pleural-based lung nodules. These are likely incidental
findings for a patient of this age.

## 2018-05-11 MED ORDER — SODIUM CHLORIDE 0.9 % IV BOLUS
1000.0000 mL | Freq: Once | INTRAVENOUS | Status: AC
  Administered 2018-05-11: 1000 mL via INTRAVENOUS

## 2018-05-11 MED ORDER — ONDANSETRON HCL 4 MG/2ML IJ SOLN: 4.0000 mg | Freq: Four times a day (QID) | INTRAMUSCULAR | Status: DC | PRN

## 2018-05-11 MED ORDER — LURASIDONE HCL 40 MG PO TABS
20.0000 mg | ORAL_TABLET | Freq: Every day | ORAL | Status: DC
  Administered 2018-05-11: 20 mg via ORAL
  Filled 2018-05-11 (×2): qty 1

## 2018-05-11 MED ORDER — TRAMADOL HCL 50 MG PO TABS
50.0000 mg | ORAL_TABLET | Freq: Two times a day (BID) | ORAL | Status: DC
  Administered 2018-05-11 – 2018-05-12 (×2): 50 mg via ORAL
  Filled 2018-05-11 (×2): qty 1

## 2018-05-11 MED ORDER — CYCLOBENZAPRINE HCL 10 MG PO TABS
10.0000 mg | ORAL_TABLET | Freq: Two times a day (BID) | ORAL | Status: DC
  Administered 2018-05-11 – 2018-05-12 (×2): 10 mg via ORAL
  Filled 2018-05-11 (×2): qty 1

## 2018-05-11 MED ORDER — LITHIUM CARBONATE 300 MG PO CAPS
300.0000 mg | ORAL_CAPSULE | Freq: Every day | ORAL | Status: DC
  Administered 2018-05-11: 300 mg via ORAL
  Filled 2018-05-11 (×2): qty 1

## 2018-05-11 MED ORDER — VENLAFAXINE HCL ER 75 MG PO CP24
300.0000 mg | ORAL_CAPSULE | Freq: Every day | ORAL | Status: DC
  Administered 2018-05-12: 300 mg via ORAL
  Filled 2018-05-11: qty 4

## 2018-05-11 MED ORDER — ACETAMINOPHEN 650 MG RE SUPP: 650.0000 mg | Freq: Four times a day (QID) | RECTAL | Status: DC | PRN

## 2018-05-11 MED ORDER — ATORVASTATIN CALCIUM 20 MG PO TABS: 20.0000 mg | ORAL_TABLET | Freq: Every day | ORAL | Status: DC

## 2018-05-11 MED ORDER — IOPAMIDOL (ISOVUE-370) INJECTION 76%
100.0000 mL | Freq: Once | INTRAVENOUS | Status: AC | PRN
  Administered 2018-05-11: 100 mL via INTRAVENOUS

## 2018-05-11 MED ORDER — ONDANSETRON HCL 4 MG PO TABS: 4.0000 mg | ORAL_TABLET | Freq: Four times a day (QID) | ORAL | Status: DC | PRN

## 2018-05-11 MED ORDER — NAPROXEN 500 MG PO TABS
500.0000 mg | ORAL_TABLET | Freq: Once | ORAL | Status: AC
  Administered 2018-05-11: 500 mg via ORAL

## 2018-05-11 MED ORDER — ENOXAPARIN SODIUM 40 MG/0.4ML ~~LOC~~ SOLN
40.0000 mg | SUBCUTANEOUS | Status: DC
  Filled 2018-05-11: qty 0.4

## 2018-05-11 MED ORDER — NAPROXEN 500 MG PO TABS
ORAL_TABLET | ORAL | Status: AC
  Filled 2018-05-11: qty 1

## 2018-05-11 MED ORDER — ASPIRIN 81 MG PO CHEW
324.0000 mg | CHEWABLE_TABLET | Freq: Once | ORAL | Status: AC
  Administered 2018-05-11: 324 mg via ORAL

## 2018-05-11 MED ORDER — ACETAMINOPHEN 325 MG PO TABS: 650.0000 mg | ORAL_TABLET | Freq: Four times a day (QID) | ORAL | Status: DC | PRN

## 2018-05-11 MED ORDER — MELATONIN 5 MG PO TABS
5.0000 mg | ORAL_TABLET | Freq: Every evening | ORAL | Status: DC | PRN
  Filled 2018-05-11: qty 1

## 2018-05-11 NOTE — H&P (Signed)
[facility] - [place] at [facility]   PATIENT NAME: Tyler BariKevin Paule    MR#:  [id number]  DATE OF BIRTH:  08/31/1986  DATE OF ADMISSION:  05/11/2018  PRIMARY CARE PHYSICIAN: System, Pcp Not In   REQUESTING/REFERRING PHYSICIAN: Scotty CourtStafford, MD  CHIEF COMPLAINT:   Chief Complaint  Patient presents with  . Chest Pain    HISTORY OF PRESENT ILLNESS:  Tyler Pearson  is a 32 y.o. male who presents with chest pain.  Patient states he is never had symptoms like this before, but that he started having sharp central chest pain this afternoon while preparing food for his daughter.  He was nonradiating, not associated with diaphoresis.  It was associated with some mild shortness of breath.  Here in the ED his work-up was initially largely within normal limits.  However, given the persistence of his symptoms hospitalist were called for admission and further evaluation  PAST MEDICAL HISTORY:   Past Medical History:  Diagnosis Date  . Anxiety   . Back pain   . High cholesterol   . Non-alcoholic fatty liver disease   . Vertigo      PAST SURGICAL HISTORY:   Past Surgical History:  Procedure Laterality Date  . APPENDECTOMY       SOCIAL HISTORY:   Social History   Tobacco Use  . Smoking status: Never Smoker  . Smokeless tobacco: Never Used  Substance Use Topics  . Alcohol use: Yes    Comment: rarely     FAMILY HISTORY:   Family History  Problem Relation Age of Onset  . Suicidality Mother   . Cirrhosis Father      DRUG ALLERGIES:  No Known Allergies  MEDICATIONS AT HOME:   Prior to Admission medications   Medication Sig Start Date End Date Taking? Authorizing Provider  atorvastatin (LIPITOR) 40 MG tablet Take 20 mg by mouth daily.   Yes [provider]  cyclobenzaprine (FLEXERIL) 10 MG tablet Take 10 mg by mouth 2 (two) times daily.   Yes [provider]  fenofibrate (TRICOR) 145 MG tablet Take 145 mg by mouth at bedtime.    Yes  [provider]  lithium carbonate 300 MG capsule Take 300 mg by mouth at bedtime.    Yes [provider]  lurasidone (LATUDA) 40 MG TABS tablet Take 20 mg by mouth at bedtime.    Yes [provider]  Melatonin 5 MG TABS Take 5 mg by mouth at bedtime as needed (sleep).   Yes [provider]  naproxen (NAPROSYN) 375 MG tablet Take 375 mg by mouth 2 (two) times daily with a meal.   Yes [provider]  Omega-3 Fatty Acids (FISH OIL) 1000 MG CAPS Take 2,000 mg by mouth daily.   Yes [provider]  traMADol (ULTRAM) 50 MG tablet Take 50 mg by mouth 2 (two) times daily.   Yes [provider]  venlafaxine XR (EFFEXOR XR) 150 MG 24 hr capsule Take 300 mg by mouth daily with breakfast.   Yes [provider]    REVIEW OF SYSTEMS:  Review of Systems  Constitutional: Negative for chills, fever, malaise/fatigue and weight loss.  HENT: Negative for ear pain, hearing loss and tinnitus.   Eyes: Negative for blurred vision, double vision, pain and redness.  Respiratory: Negative for cough, hemoptysis and shortness of breath.   Cardiovascular: Positive for chest pain. Negative for palpitations, orthopnea and leg swelling.  Gastrointestinal: Negative for abdominal pain, constipation, diarrhea, nausea  and vomiting.  Genitourinary: Negative for dysuria, frequency and hematuria.  Musculoskeletal: Negative for back pain, joint pain and neck pain.  Skin:       No acne, rash, or lesions  Neurological: Negative for dizziness, tremors, focal weakness and weakness.  Endo/Heme/Allergies: Negative for polydipsia. Does not bruise/bleed easily.  Psychiatric/Behavioral: Negative for depression. The patient is not nervous/anxious and does not have insomnia.      VITAL SIGNS:   Vitals:   05/11/18 1900 05/11/18 2000 05/11/18 2100 05/11/18 2120  BP: (!) 137/93 (!) 134/97 133/87 135/90  Pulse: 92 (!) 102 92 95  Resp: 18 17 14 16   Temp:       TempSrc:      SpO2: 100% 99% 97% 98%  Weight:      Height:       Wt Readings from Last 3 Encounters:  05/11/18 99.8 kg (220 lb)  05/11/18 99.8 kg (220 lb)    PHYSICAL EXAMINATION:  Physical Exam  Vitals reviewed. Constitutional: He is oriented to person, place, and time. He appears well-developed and well-nourished. No distress.  HENT:  Head: Normocephalic and atraumatic.  Mouth/Throat: Oropharynx is clear and moist.  Eyes: Pupils are equal, round, and reactive to light. Conjunctivae and EOM are normal. No scleral icterus.  Neck: Normal range of motion. Neck supple. No JVD present. No thyromegaly present.  Cardiovascular: Normal rate, regular rhythm and intact distal pulses. Exam reveals no gallop and no friction rub.  No murmur heard. Respiratory: Effort normal and breath sounds normal. No respiratory distress. He has no wheezes. He has no rales.  GI: Soft. Bowel sounds are normal. He exhibits no distension. There is no tenderness.  Musculoskeletal: Normal range of motion. He exhibits no edema.  No arthritis, no gout  Lymphadenopathy:    He has no cervical adenopathy.  Neurological: He is alert and oriented to person, place, and time. No cranial nerve deficit.  No dysarthria, no aphasia  Skin: Skin is warm and dry. No rash noted. No erythema.  Psychiatric: He has a normal mood and affect. His behavior is normal. Judgment and thought content normal.    LABORATORY PANEL:   CBC Recent Labs  Lab 05/11/18 1617  WBC 9.4  HGB 14.8  HCT 42.1  PLT 262   ------------------------------------------------------------------------------------------------------------------  Chemistries  Recent Labs  Lab 05/11/18 1617  NA 137  K 3.8  CL 100  CO2 28  GLUCOSE 101*  BUN 14  CREATININE 1.07  CALCIUM 10.2   ------------------------------------------------------------------------------------------------------------------  Cardiac Enzymes Recent Labs  Lab 05/11/18 1900   TROPONINI <0.03   ------------------------------------------------------------------------------------------------------------------  RADIOLOGY:  Dg Chest 2 View  Result Date: 05/11/2018 CLINICAL DATA:  Chest pain EXAM: CHEST - 2 VIEW COMPARISON:  None. FINDINGS: Lungs are clear. Heart size and pulmonary vascularity are normal. No adenopathy. No pneumothorax. No bone lesions. IMPRESSION: No edema or consolidation. Electronically Signed   By: Bretta Bang III M.D.   On: 05/11/2018 16:37   Ct Angio Chest Aorta W And/or Wo Contrast  Result Date: 05/11/2018 CLINICAL DATA:  32 year old with chest pain and upper back pain. Concern for acute aortic syndrome. EXAM: CT ANGIOGRAPHY CHEST WITH CONTRAST TECHNIQUE: Multidetector CT imaging of the chest was performed using the standard protocol during bolus administration of intravenous contrast. Multiplanar CT image reconstructions and MIPs were obtained to evaluate the vascular anatomy. CONTRAST:  ISOVUE-370 IOPAMIDOL (ISOVUE-370) INJECTION 76% COMPARISON:  Chest radiograph 05/11/2018 FINDINGS: Cardiovascular: Normal caliber of the thoracic aorta without evidence for dissection  or intramural hematoma. Typical aortic arch anatomy and the proximal great vessels are patent. No evidence for central or large pulmonary embolism. Heart size is normal without significant pericardial fluid. Mediastinum/Nodes: Mild asymmetry of the thyroid tissue, right side greater than left. Evidence for thymic tissue. Esophagus is unremarkable. No mediastinal or hilar lymphadenopathy. Lungs/Pleura: Trachea and mainstem bronchi are patent. No pleural effusions. Lungs are clear without airspace disease or lung consolidation. Tiny pleural-based nodule along the right minor fissure on sequence 7, image 81. There is another pleural-based nodule along the right major fissure on sequence 7, image 76 measuring up to 5 mm. These nodule are likely incidental findings in a patient of this  age. Upper Abdomen: Low-attenuation the liver with evidence of fatty sparing adjacent to the gallbladder. Musculoskeletal: No acute abnormality. Review of the MIP images confirms the above findings. IMPRESSION: No acute chest abnormality. No evidence for an aortic dissection or aneurysm. No evidence for a large or central pulmonary embolism. Hepatic steatosis. Two small pleural-based lung nodules. These are likely incidental findings for a patient of this age. Electronically Signed   By: Richarda Overlie M.D.   On: 05/11/2018 17:21    EKG:   Orders placed or performed during the hospital encounter of 05/11/18  . ED EKG within 10 minutes  . ED EKG within 10 minutes    IMPRESSION AND PLAN:  Principal Problem:   Chest pain -cycle cardiac enzymes, get an echocardiogram and a cardiology consult Active Problems:   HLD (hyperlipidemia) -Home dose antilipid   Anxiety -home dose anxiolytic  Chart review performed and case discussed with ED provider. Labs, imaging and/or ECG reviewed by provider and discussed with patient/family. Management plans discussed with the patient and/or family.  DVT PROPHYLAXIS: SubQ lovenox  GI PROPHYLAXIS: None  ADMISSION STATUS: Observation  CODE STATUS: Full  TOTAL TIME TAKING CARE OF THIS PATIENT: 40 minutes.   Sherrine Salberg FIELDING 05/11/2018, 10:30 PM  [employer]  [phone]  CC: Primary care physician; System, Pcp Not In  Note:  This document was prepared using Dragon voice recognition software and may include unintentional dictation errors.

## 2018-05-11 NOTE — ED Notes (Signed)
Pt waiting on transfer. Pt alert.  No chest pain   Family with pt.  nsr on monitor

## 2018-05-11 NOTE — ED Notes (Signed)
md at bedside.  Pt reports chest pain in center of chest earlier today.  Pain radiates into left neck and left jaw.  No sob.  Hx high cholesterol .  Iv in place. Ems reports 4 baby asa given to pt at urgent care.

## 2018-05-11 NOTE — ED Notes (Signed)
[facility] have no beds available at this time.  Dr Scotty Courtstafford aware.

## 2018-05-11 NOTE — ED Notes (Signed)
[facility]

## 2018-05-11 NOTE — ED Provider Notes (Addendum)
[facility]    CSN: [id number] Arrival date & time: 05/11/18  1451   History   Chief Complaint Chief Complaint  Patient presents with  . Chest Pain   HPI  32 year old male presents with chest pain.  Patient states that he developed pain at around 2:45 PM.  He states that he experienced chest pain and upper back pain.  She states that he was making a meal when he experienced the pain.  Reports of association with exertion.  Patient states that his pain was severe.  Patient states that it felt like it started in his back and radiated forward.  Described as pressure.  Associated neck and jaw pain, left-sided.  Patient reports associated lightheadedness as well.  He states that the pain lasted briefly and then resolved.  He reports associated shortness of breath.  He is currently pain-free.  Patient states that he has never had this before.  Patient states that he has had prior EKGs and has been told they were abnormal but I have no record of this.  Patient is currently anxious.  Past Medical History:  Diagnosis Date  . Anxiety   . Back pain   . High cholesterol   . Non-alcoholic fatty liver disease   . Vertigo     Patient Active Problem List   Diagnosis Date Noted  . ACUTE PHARYNGITIS 04/28/2010  . HEADACHE 04/28/2010  . NAUSEA ALONE 04/28/2010  . BACK PAIN, THORACIC REGION 02/20/2009  . LUMBAR STRAIN 02/20/2009  . CONTUSION OF BACK 02/20/2009    Past Surgical History:  Procedure Laterality Date  . APPENDECTOMY      Home Medications    Prior to Admission medications   Medication Sig Start Date End Date Taking? Authorizing Provider  atorvastatin (LIPITOR) 40 MG tablet Take 20 mg by mouth daily.   Yes [provider]  cyclobenzaprine (FLEXERIL) 10 MG tablet Take 10 mg by mouth 2 (two) times daily.   Yes [provider]  fenofibrate (TRICOR) 145 MG tablet Take 145 mg by mouth daily.   Yes [provider]  lithium carbonate 300 MG  capsule Take 300 mg by mouth daily.   Yes [provider]  lurasidone (LATUDA) 40 MG TABS tablet Take 20 mg by mouth daily with breakfast.   Yes [provider]  naproxen (NAPROSYN) 375 MG tablet Take 375 mg by mouth 2 (two) times daily with a meal.   Yes [provider]  traMADol (ULTRAM) 50 MG tablet Take 50 mg by mouth 2 (two) times daily.   Yes [provider]  venlafaxine XR (EFFEXOR XR) 150 MG 24 hr capsule Take 300 mg by mouth daily with breakfast.   Yes [provider]    Family History Family History  Problem Relation Age of Onset  . Suicidality Mother   . Cirrhosis Father     Social History Social History   Tobacco Use  . Smoking status: Never Smoker  . Smokeless tobacco: Never Used  Substance Use Topics  . Alcohol use: Yes    Comment: rarely  . Drug use: Never     Allergies   Patient has no known allergies.   Review of Systems Review of Systems  Respiratory: Positive for shortness of breath.   Cardiovascular: Positive for chest pain.  Musculoskeletal: Positive for back pain.  Neurological: Positive for light-headedness.  Psychiatric/Behavioral: The patient is nervous/anxious.    Physical Exam Triage Vital Signs ED Triage Vitals  Enc Vitals Group  BP 05/11/18 1506 (!) 145/102     Pulse Rate 05/11/18 1506 97     Resp 05/11/18 1506 20     Temp 05/11/18 1506 98.1 F (36.7 C)     Temp Source 05/11/18 1506 Oral     SpO2 05/11/18 1506 100 %     Weight 05/11/18 1504 220 lb (99.8 kg)     Height 05/11/18 1504 5\' 9"  (1.753 m)     Head Circumference --      Peak Flow --      Pain Score 05/11/18 1504 0     Pain Loc --      Pain Edu? --      Excl. in GC? --    Updated Vital Signs BP (!) 145/102 (BP Location: Left Arm)   Pulse 97   Temp 98.1 F (36.7 C) (Oral)   Resp 20   Ht 5\' 9"  (1.753 m)   Wt 220 lb (99.8 kg)   SpO2 100%   BMI 32.49 kg/m   Visual Acuity Right Eye Distance:   Left Eye Distance:     Bilateral Distance:    Right Eye Near:   Left Eye Near:    Bilateral Near:     Physical Exam  Constitutional: He is oriented to person, place, and time. He appears well-developed. No distress.  HENT:  Head: Normocephalic and atraumatic.  Cardiovascular: Normal rate and regular rhythm.  Pulmonary/Chest: Effort normal and breath sounds normal. He has no wheezes. He has no rales.  Abdominal: Soft. He exhibits no distension. There is no tenderness.  Neurological: He is alert and oriented to person, place, and time.  Psychiatric: His behavior is normal.  Anxious.  Nursing note and vitals reviewed.  UC Treatments / Results  Labs (all labs ordered are listed, but only abnormal results are displayed) Labs Reviewed - No data to display  EKG   Date: 05/11/2018  EKG Time: 3:55 PM  Rate: 90  Rhythm: Normal sinus rhythm.  Axis: Right axis  Intervals:Normal.   ST&T Change: T wave inversion in lead III and aVF.  Narrative Interpretation: Normal sinus rhythm with a rate of 90.  T wave inversion in lead III and aVF, possible ischemia.   No prior to compare to.   Radiology No results found.  Procedures Procedures (including critical care time)  Medications Ordered in UC Medications  aspirin chewable tablet 324 mg (324 mg Oral Given 05/11/18 1521)    Initial Impression / Assessment and Plan / UC Course  I have reviewed the triage vital signs and the nursing notes.  Pertinent labs & imaging results that were available during my care of the patient were reviewed by me and considered in my medical decision making (see chart for details).    32 year old male presents with chest pain.  EKG findings concerning for ischemia.  Patient given aspirin and sent via EMS directly to the hospital.  Final Clinical Impressions(s) / UC Diagnoses   Final diagnoses:  Chest pain, unspecified type   Discharge Instructions   None    ED Prescriptions    None     Controlled Substance  Prescriptions  Controlled Substance Registry consulted? Not Applicable   Tommie SamsCook, Heide Brossart G, DO 05/11/18 1555    Everlene Otherook, Myrical Andujo G, DO 05/11/18 1724

## 2018-05-11 NOTE — ED Notes (Signed)
Amy C, RN aware of bed assigned

## 2018-05-11 NOTE — ED Notes (Signed)
This tech answered call bell. Pt requested to use the bathroom and pt was given a urinal. This tech also collected urine specimen and sent it to lab. Pts call bell was within reach before this tech left the room.

## 2018-05-11 NOTE — ED Provider Notes (Signed)
[facility] and [facility] on [facility].  I discussed with the hospitalist to admit at this hospital.   Sharman CheekStafford, Anneth Brunell, MD 05/11/18 2136

## 2018-05-11 NOTE — ED Notes (Signed)
Report called to lexi rn floor nurse 

## 2018-05-11 NOTE — ED Notes (Signed)
Pt alert.  nsr on monitor.  Family with pt.  

## 2018-05-11 NOTE — ED Provider Notes (Signed)
[facility] Emergency Department Provider Note ___________________________________________   First MD Initiated Contact with Patient 05/11/18 1607     (approximate)  I have reviewed the triage vital signs and the nursing notes.   HISTORY  Chief Complaint Chest Pain  HPI Tyler Pearson is a 32 y.o. male with a history of back pain high cholesterol as well as vertigo who is presenting to the emergency department today with chest pain that started about 2 PM while he was making lunch at home.  He says the pain was 10 out of 10 and felt somewhat stabbing him with a hot knife.  He says he also felt back pressure at that time which felt like someone was pushing him on his back to the upper thoracic back.  He says that he also had shortness of breath at that time but the symptoms have since reduced and his pain is now 1 out of 10.  He says the pain is radiating from the center of his chest the left side of his chest.  Says that usually with his anxiety attacks he gets very sweaty which he did not experience today.  Says that also his anxiety attacks are usually from being on public which he also did not experience today.  [facility].  Patient denies any cardiac history in first-degree relatives. Past Medical History:  Diagnosis Date  . Anxiety   . Back pain   . High cholesterol   . Non-alcoholic fatty liver disease   . Vertigo     Patient Active Problem List   Diagnosis Date Noted  . ACUTE PHARYNGITIS 04/28/2010  . HEADACHE 04/28/2010  . NAUSEA ALONE 04/28/2010  . BACK PAIN, THORACIC REGION 02/20/2009  . LUMBAR STRAIN 02/20/2009  . CONTUSION OF BACK 02/20/2009    Past Surgical History:  Procedure Laterality Date  . APPENDECTOMY      Prior to Admission medications   Medication Sig Start Date End Date Taking? Authorizing Provider  atorvastatin (LIPITOR) 40 MG tablet Take 20 mg by mouth daily.    [provider]  cyclobenzaprine (FLEXERIL)  10 MG tablet Take 10 mg by mouth 2 (two) times daily.    [provider]  fenofibrate (TRICOR) 145 MG tablet Take 145 mg by mouth daily.    [provider]  lithium carbonate 300 MG capsule Take 300 mg by mouth daily.    [provider]  lurasidone (LATUDA) 40 MG TABS tablet Take 20 mg by mouth daily with breakfast.    [provider]  naproxen (NAPROSYN) 375 MG tablet Take 375 mg by mouth 2 (two) times daily with a meal.    [provider]  traMADol (ULTRAM) 50 MG tablet Take 50 mg by mouth 2 (two) times daily.    [provider]  venlafaxine XR (EFFEXOR XR) 150 MG 24 hr capsule Take 300 mg by mouth daily with breakfast.    [provider]    Allergies Patient has no known allergies.  Family History  Problem Relation Age of Onset  . Suicidality Mother   . Cirrhosis Father     Social History Social History   Tobacco Use  . Smoking status: Never Smoker  . Smokeless tobacco: Never Used  Substance Use Topics  . Alcohol use: Yes    Comment: rarely  . Drug use: Never    Review of Systems  Constitutional: No fever/chills Eyes: No visual changes. ENT: No sore throat. Cardiovascular: As above Respiratory:  As above Gastrointestinal: No abdominal pain.  No nausea, no vomiting.  No diarrhea.  No constipation. Genitourinary: Negative for dysuria. Musculoskeletal: As above Skin: Negative for rash. Neurological: Negative for headaches, focal weakness or numbness.   ____________________________________________   PHYSICAL EXAM:  VITAL SIGNS: ED Triage Vitals  Enc Vitals Group     BP 05/11/18 1610 (!) 156/101     Pulse Rate 05/11/18 1610 100     Resp 05/11/18 1610 20     Temp 05/11/18 1612 98.6 F (37 C)     Temp Source 05/11/18 1610 Oral     SpO2 05/11/18 1610 100 %     Weight 05/11/18 1611 220 lb (99.8 kg)     Height 05/11/18 1611 5\' 9"  (1.753 m)     Head Circumference --      Peak Flow --      Pain Score  05/11/18 1610 0     Pain Loc --      Pain Edu? --      Excl. in GC? --     Constitutional: Alert and oriented. Well appearing and in no acute distress. Eyes: Conjunctivae are normal.  Head: Atraumatic. Nose: No congestion/rhinnorhea. Mouth/Throat: Mucous membranes are moist.  Neck: No stridor.   Cardiovascular: Normal rate, regular rhythm. Grossly normal heart sounds.  Good peripheral circulation with equal and bilateral radial as well as dorsalis pedis pulses.   Respiratory: Normal respiratory effort.  No retractions. Lungs CTAB. Gastrointestinal: Soft and nontender. No distention. Musculoskeletal: No lower extremity tenderness nor edema.  No joint effusions. Neurologic:  Normal speech and language. No gross focal neurologic deficits are appreciated. Skin:  Skin is warm, dry and intact. No rash noted. Psychiatric: Mood and affect are normal. Speech and behavior are normal.  ____________________________________________   LABS (all labs ordered are listed, but only abnormal results are displayed)  Labs Reviewed  CBC  BASIC METABOLIC PANEL  TROPONIN I   ____________________________________________  EKG   ED ECG REPORT I, Arelia Longest, the attending physician, personally viewed and interpreted this ECG.   Date: 05/11/2018  EKG Time: 1611  Rate: 108  Rhythm: sinus tachycardia  Axis: Right axis  Intervals:none  ST&T Change: No ST segment elevation or depression.  T wave inversions in 3 and aVF.  Consistent with prehospital EKGs.  No previous for comparison.  However, patient reported urgent care that he has had previous EKGs that were abnormal. ____________________________________________  RADIOLOGY  CT angiography without acute process.  2 small pleural-based lung nodules likely incidental findings.  Chest x-ray without edema or consolidation. ____________________________________________   PROCEDURES  Procedure(s) performed:   Procedures  Critical Care  performed:   ____________________________________________   INITIAL IMPRESSION / ASSESSMENT AND PLAN / ED COURSE  Pertinent labs & imaging results that were available during my care of the patient were reviewed by me and considered in my medical decision making (see chart for details).  Differential diagnosis includes, but is not limited to, ACS, aortic dissection, pulmonary embolism, cardiac tamponade, pneumothorax, pneumonia, pericarditis, myocarditis, GI-related causes including esophagitis/gastritis, and musculoskeletal chest wall pain.   As part of my medical decision making, I reviewed the following data within the electronic MEDICAL RECORD NUMBER Notes from prior ED visits  ----------------------------------------- 8:10 PM on 05/11/2018 -----------------------------------------  Patient with central chest pain radiating to the back as well as left shoulder associated with shortness of breath.  Pain-free at this time.  However, appears to have possibly new T wave inversions.  Concerning story.  Patient to be transferred to the [facility].  Pending acceptance at this time.  Paperwork submitted. ____________________________________________   FINAL CLINICAL IMPRESSION(S) / ED DIAGNOSES  Chest pain.  NEW MEDICATIONS STARTED DURING THIS VISIT:  New Prescriptions   No medications on file     Note:  This document was prepared using Dragon voice recognition software and may include unintentional dictation errors.     Myrna Blazer, MD 05/11/18 2011

## 2018-05-11 NOTE — ED Notes (Signed)
[facility]

## 2018-05-11 NOTE — ED Triage Notes (Signed)
Patient c/o upper back pain, shortness of breath, dizziness, jaw pain and chest pain that started 30 minutes ago.

## 2018-05-11 NOTE — ED Triage Notes (Signed)
[facility].  Pt had chest pain earlier today.  No pain now.  No sob.  Pt alert.  Iv in place.

## 2018-05-12 ENCOUNTER — Observation Stay
Admit: 2018-05-12 | Discharge: 2018-05-12 | Disposition: A | Discharge status: Home / Self Care | Attending: Internal Medicine | Admitting: Internal Medicine

## 2018-05-12 LAB — BASIC METABOLIC PANEL
Anion gap: 5 (ref 5–15)
BUN: 16 mg/dL (ref 6–20)
CHLORIDE: 104 mmol/L (ref 98–111)
CO2: 30 mmol/L (ref 22–32)
Calcium: 9.5 mg/dL (ref 8.9–10.3)
Creatinine, Ser: 1.22 mg/dL (ref 0.61–1.24)
GFR calc non Af Amer: >60 mL/min (ref >60)
Glucose, Bld: 96 mg/dL (ref 70–99)
POTASSIUM: 4 mmol/L (ref 3.5–5.1)
SODIUM: 139 mmol/L (ref 135–145)

## 2018-05-12 LAB — CBC
HEMATOCRIT: 39.5 % — AB (ref 40.0–52.0)
Hemoglobin: 14.1 g/dL (ref 13.0–18.0)
MCH: 32 pg (ref 26.0–34.0)
MCHC: 35.6 g/dL (ref 32.0–36.0)
MCV: 89.7 fL (ref 80.0–100.0)
Platelets: 225 10*3/uL (ref 150–440)
RBC: 4.4 MIL/uL (ref 4.40–5.90)
RDW: 13.3 % (ref 11.5–14.5)
WBC: 8.1 10*3/uL (ref 3.8–10.6)

## 2018-05-12 LAB — ECHOCARDIOGRAM COMPLETE
Height: 69 in
Weight: 3630.4 oz

## 2018-05-12 LAB — TROPONIN I: Troponin I: <0.03 ng/mL (ref <0.03)

## 2018-05-12 NOTE — Discharge Summary (Signed)
[facility] -  at [facility]   PATIENT NAME: Tyler Pearson    MR#:  [id number]  DATE OF BIRTH:  [birth year]  DATE OF ADMISSION:  05/11/2018 ADMITTING PHYSICIAN: Oralia Manis, MD  DATE OF DISCHARGE: 05/12/2018  1:50 PM  PRIMARY CARE PHYSICIAN: System, Pcp Not In    ADMISSION DIAGNOSIS:  Chest pain, unspecified type [R07.9]  DISCHARGE DIAGNOSIS:  Principal Problem:   Chest pain Active Problems:   HLD (hyperlipidemia)   Anxiety   SECONDARY DIAGNOSIS:   Past Medical History:  Diagnosis Date  . Anxiety   . Back pain   . High cholesterol   . Non-alcoholic fatty liver disease   . Vertigo     HOSPITAL COURSE:   32 year old male with past medical history of anxiety, chronic back pain, nonalcoholic fatty liver disease, hyperlipidemia, history of vertigo, bipolar disorder who presented to the hospital due to chest pain.  1.  Chest pain- patient was observed overnight on telemetry, had 3 sets of cardiac markers checked which were negative.  Patient's chest pain was quite atypical.  Patient was seen by cardiology underwent an exercise treadmill stress test which was negative for any EKG changes or acute ischemia.  Patient is clinically asymptomatic now and therefore being discharged home.  2.  Bipolar disorder- patient will continue his lithium, Latuda, Effexor.  3.  HyperLipidemia-patient will continue his atorvastatin.  4.  History of chronic back pain-patient will continue his tramadol, Flexeril.  DISCHARGE CONDITIONS:   Stable  CONSULTS OBTAINED:  Treatment Team:  Lamar Blinks, MD  DRUG ALLERGIES:  No Known Allergies  DISCHARGE MEDICATIONS:   Allergies as of 05/12/2018   No Known Allergies     Medication List    TAKE these medications   atorvastatin 40 MG tablet Commonly known as:  LIPITOR Take 20 mg by mouth daily.   cyclobenzaprine 10 MG tablet Commonly known as:  FLEXERIL Take 10 mg by mouth 2 (two) times daily.   EFFEXOR XR  150 MG 24 hr capsule Generic drug:  venlafaxine XR Take 300 mg by mouth daily with breakfast.   fenofibrate 145 MG tablet Commonly known as:  TRICOR Take 145 mg by mouth at bedtime.   Fish Oil 1000 MG Caps Take 2,000 mg by mouth daily.   lithium carbonate 300 MG capsule Take 300 mg by mouth at bedtime.   lurasidone 40 MG Tabs tablet Commonly known as:  LATUDA Take 20 mg by mouth at bedtime.   Melatonin 5 MG Tabs Take 5 mg by mouth at bedtime as needed (sleep).   naproxen 375 MG tablet Commonly known as:  NAPROSYN Take 375 mg by mouth 2 (two) times daily with a meal.   traMADol 50 MG tablet Commonly known as:  ULTRAM Take 50 mg by mouth 2 (two) times daily.         DISCHARGE INSTRUCTIONS:   DIET:  Regular diet  DISCHARGE CONDITION:  Stable  ACTIVITY:  Activity as tolerated  OXYGEN:  Home Oxygen: No.   Oxygen Delivery: room air  DISCHARGE LOCATION:  home   If you experience worsening of your admission symptoms, develop shortness of breath, life threatening emergency, suicidal or homicidal thoughts you must seek medical attention immediately by calling 911 or calling your MD immediately  if symptoms less severe.  You Must read complete instructions/literature along with all the possible adverse reactions/side effects for all the Medicines you take and that have been prescribed to you. Take any new Medicines after  you have completely understood and accpet all the possible adverse reactions/side effects.   Please note  You were cared for by a hospitalist during your hospital stay. If you have any questions about your discharge medications or the care you received while you were in the hospital after you are discharged, you can call the unit and asked to speak with the hospitalist on call if the hospitalist that took care of you is not available. Once you are discharged, your primary care physician will handle any further medical issues. Please note that NO  REFILLS for any discharge medications will be authorized once you are discharged, as it is imperative that you return to your primary care physician (or establish a relationship with a primary care physician if you do not have one) for your aftercare needs so that they can reassess your need for medications and monitor your lab values.     Today   Chest pain now resolved, no further acute symptoms presently.  Had a exercise treadmill stress test which was negative.  Will discharge home today.  VITAL SIGNS:  Blood pressure 120/78, pulse 84, temperature 98.2 F (36.8 C), temperature source Oral, resp. rate 17, height 5\' 9"  (1.753 m), weight 102.9 kg (226 lb 14.4 oz), SpO2 97 %.  I/O:    Intake/Output Summary (Last 24 hours) at 05/12/2018 1548 Last data filed at 05/11/2018 2332 Gross per 24 hour  Intake -  Output 500 ml  Net -500 ml    PHYSICAL EXAMINATION:  GENERAL:  32 y.o.-year-old patient lying in the bed with no acute distress.  EYES: Pupils equal, round, reactive to light and accommodation. No scleral icterus. Extraocular muscles intact.  HEENT: Head atraumatic, normocephalic. Oropharynx and nasopharynx clear.  NECK:  Supple, no jugular venous distention. No thyroid enlargement, no tenderness.  LUNGS: Normal breath sounds bilaterally, no wheezing, rales,rhonchi. No use of accessory muscles of respiration.  CARDIOVASCULAR: S1, S2 normal. No murmurs, rubs, or gallops.  ABDOMEN: Soft, non-tender, non-distended. Bowel sounds present. No organomegaly or mass.  EXTREMITIES: No pedal edema, cyanosis, or clubbing.  NEUROLOGIC: Cranial nerves II through XII are intact. No focal motor or sensory defecits b/l.  PSYCHIATRIC: The patient is alert and oriented x 3.  SKIN: No obvious rash, lesion, or ulcer.   DATA REVIEW:   CBC Recent Labs  Lab 05/12/18 0519  WBC 8.1  HGB 14.1  HCT 39.5*  PLT 225    Chemistries  Recent Labs  Lab 05/12/18 0519  NA 139  K 4.0  CL 104  CO2 30   GLUCOSE 96  BUN 16  CREATININE 1.22  CALCIUM 9.5    Cardiac Enzymes Recent Labs  Lab 05/12/18 0007  TROPONINI <0.03    Microbiology Results  No results found for this or any previous visit.  RADIOLOGY:  Dg Chest 2 View  Result Date: 05/11/2018 CLINICAL DATA:  Chest pain EXAM: CHEST - 2 VIEW COMPARISON:  None. FINDINGS: Lungs are clear. Heart size and pulmonary vascularity are normal. No adenopathy. No pneumothorax. No bone lesions. IMPRESSION: No edema or consolidation. Electronically Signed   By: Bretta Bang III M.D.   On: 05/11/2018 16:37   Ct Angio Chest Aorta W And/or Wo Contrast  Result Date: 05/11/2018 CLINICAL DATA:  32 year old with chest pain and upper back pain. Concern for acute aortic syndrome. EXAM: CT ANGIOGRAPHY CHEST WITH CONTRAST TECHNIQUE: Multidetector CT imaging of the chest was performed using the standard protocol during bolus administration of intravenous contrast. Multiplanar CT image  reconstructions and MIPs were obtained to evaluate the vascular anatomy. CONTRAST:  100mL ISOVUE-370 IOPAMIDOL (ISOVUE-370) INJECTION 76% COMPARISON:  Chest radiograph 05/11/2018 FINDINGS: Cardiovascular: Normal caliber of the thoracic aorta without evidence for dissection or intramural hematoma. Typical aortic arch anatomy and the proximal great vessels are patent. No evidence for central or large pulmonary embolism. Heart size is normal without significant pericardial fluid. Mediastinum/Nodes: Mild asymmetry of the thyroid tissue, right side greater than left. Evidence for thymic tissue. Esophagus is unremarkable. No mediastinal or hilar lymphadenopathy. Lungs/Pleura: Trachea and mainstem bronchi are patent. No pleural effusions. Lungs are clear without airspace disease or lung consolidation. Tiny pleural-based nodule along the right minor fissure on sequence 7, image 81. There is another pleural-based nodule along the right major fissure on sequence 7, image 76 measuring up to 5  mm. These nodule are likely incidental findings in a patient of this age. Upper Abdomen: Low-attenuation the liver with evidence of fatty sparing adjacent to the gallbladder. Musculoskeletal: No acute abnormality. Review of the MIP images confirms the above findings. IMPRESSION: No acute chest abnormality. No evidence for an aortic dissection or aneurysm. No evidence for a large or central pulmonary embolism. Hepatic steatosis. Two small pleural-based lung nodules. These are likely incidental findings for a patient of this age. Electronically Signed   By: Richarda OverlieAdam  Henn M.D.   On: 05/11/2018 17:21      Management plans discussed with the patient, family and they are in agreement.  CODE STATUS:     Code Status Orders  (From admission, onward)        Start     Ordered   05/11/18 2319  Full code  Continuous     05/11/18 2318    Code Status History    This patient has a current code status but no historical code status.      TOTAL TIME TAKING CARE OF THIS PATIENT: 40 minutes.    Houston SirenSAINANI,Alexine Pilant J M.D on 05/12/2018 at 3:48 PM  Between 7am to 6pm - Pager - [phone]  After 6pm go to [email or web address] - Social research officer, governmentpassword [email or web address]  [facility]  Office  [phone]  CC: Primary care physician; System, Pcp Not In

## 2018-05-12 NOTE — Progress Notes (Signed)
  No more chest pain today Patient underwent a bruce protocol stress test to 8 min without chest pain or new ecg changes and therefore at low risk OK for dc to home from cardiac standpoint with follow up for risk factor modification in a few weeks

## 2018-05-12 NOTE — Consult Note (Signed)
[facility] Cardiology Consultation Note  Patient ID: Tyler Pearson, MRN: [id number], DOB/AGE: 34-Mar-1987 34 y.o. Admit date: 05/11/2018   Date of Consult: 05/12/2018 Primary Physician: System, Pcp Not In Primary Cardiologist: None  Chief Complaint:  Chief Complaint  Patient presents with  . Chest Pain   Reason for Consult: Chest pain  HPI: 32 y.o. male with known hyperlipidemia having an acute onset of substernal chest discomfort radiating into the left upper chest into the left neck while fixing dinner for his daughter.  This was sudden onset and associated with some shortness of breath and dizziness.  This lasted for about 5 minutes and then resolved.  He was seen in the emergency room at which time he has right axis deviation EKG but no other new EKG changes and troponin has been normal.  Since then the patient has done fairly well with no further evidence of significant chest discomfort or concerns.  He does have known high intensity cholesterol therapy for family history of cardiovascular disease and otherwise feels well  Past Medical History:  Diagnosis Date  . Anxiety   . Back pain   . High cholesterol   . Non-alcoholic fatty liver disease   . Vertigo       Surgical History:  Past Surgical History:  Procedure Laterality Date  . APPENDECTOMY       Home Meds: Prior to Admission medications   Medication Sig Start Date End Date Taking? Authorizing Provider  atorvastatin (LIPITOR) 40 MG tablet Take 20 mg by mouth daily.   Yes [provider]  cyclobenzaprine (FLEXERIL) 10 MG tablet Take 10 mg by mouth 2 (two) times daily.   Yes [provider]  fenofibrate (TRICOR) 145 MG tablet Take 145 mg by mouth at bedtime.    Yes [provider]  lithium carbonate 300 MG capsule Take 300 mg by mouth at bedtime.    Yes [provider]  lurasidone (LATUDA) 40 MG TABS tablet Take 20 mg by mouth at bedtime.    Yes [provider]  Melatonin 5 MG  TABS Take 5 mg by mouth at bedtime as needed (sleep).   Yes [provider]  naproxen (NAPROSYN) 375 MG tablet Take 375 mg by mouth 2 (two) times daily with a meal.   Yes [provider]  Omega-3 Fatty Acids (FISH OIL) 1000 MG CAPS Take 2,000 mg by mouth daily.   Yes [provider]  traMADol (ULTRAM) 50 MG tablet Take 50 mg by mouth 2 (two) times daily.   Yes [provider]  venlafaxine XR (EFFEXOR XR) 150 MG 24 hr capsule Take 300 mg by mouth daily with breakfast.   Yes [provider]    Inpatient Medications:  . atorvastatin  20 mg Oral Daily  . cyclobenzaprine  10 mg Oral BID  . enoxaparin (LOVENOX) injection  40 mg Subcutaneous Q24H  . lithium carbonate  300 mg Oral QHS  . lurasidone  20 mg Oral QHS  . traMADol  50 mg Oral BID  . venlafaxine XR  300 mg Oral Q breakfast     Allergies: No Known Allergies  Social History   Socioeconomic History  . Marital status: Married    Spouse name: Not on file  . Number of children: Not on file  . Years of education: Not on file  . Highest education level: Not on file  Occupational History  . Not on file  Social Needs  . Financial resource strain: Not on file  .  Food insecurity:    Worry: Not on file    Inability: Not on file  . Transportation needs:    Medical: Not on file    Non-medical: Not on file  Tobacco Use  . Smoking status: Never Smoker  . Smokeless tobacco: Never Used  Substance and Sexual Activity  . Alcohol use: Yes    Comment: rarely  . Drug use: Never  . Sexual activity: Not on file  Lifestyle  . Physical activity:    Days per week: Not on file    Minutes per session: Not on file  . Stress: Not on file  Relationships  . Social connections:    Talks on phone: Not on file    Gets together: Not on file    Attends religious service: Not on file    Active member of club or organization: Not on file    Attends meetings of clubs or organizations: Not on file     Relationship status: Not on file  . Intimate partner violence:    Fear of current or ex partner: Not on file    Emotionally abused: Not on file    Physically abused: Not on file    Forced sexual activity: Not on file  Other Topics Concern  . Not on file  Social History Narrative  . Not on file     Family History  Problem Relation Age of Onset  . Suicidality Mother   . Cirrhosis Father      Review of Systems Positive for chest pain Negative for: General:  chills, fever, night sweats or weight changes.  Cardiovascular: PND orthopnea syncope dizziness  Dermatological skin lesions rashes Respiratory: Cough congestion Urologic: Frequent urination urination at night and hematuria Abdominal: negative for nausea, vomiting, diarrhea, bright red blood per rectum, melena, or hematemesis Neurologic: negative for visual changes, and/or hearing changes  All other systems reviewed and are otherwise negative except as noted above.  Labs: Recent Labs    05/11/18 1617 05/11/18 1900 05/12/18 0007  TROPONINI <0.03 <0.03 <0.03   Lab Results  Component Value Date   WBC 8.1 05/12/2018   HGB 14.1 05/12/2018   HCT 39.5 (L) 05/12/2018   MCV 89.7 05/12/2018   PLT 225 05/12/2018    Recent Labs  Lab 05/12/18 0519  NA 139  K 4.0  CL 104  CO2 30  BUN 16  CREATININE 1.22  CALCIUM 9.5  GLUCOSE 96   No results found for: CHOL, HDL, LDLCALC, TRIG No results found for: DDIMER  Radiology/Studies:  Dg Chest 2 View  Result Date: 05/11/2018 CLINICAL DATA:  Chest pain EXAM: CHEST - 2 VIEW COMPARISON:  None. FINDINGS: Lungs are clear. Heart size and pulmonary vascularity are normal. No adenopathy. No pneumothorax. No bone lesions. IMPRESSION: No edema or consolidation. Electronically Signed   By: Bretta BangWilliam  Woodruff III M.D.   On: 05/11/2018 16:37   Ct Angio Chest Aorta W And/or Wo Contrast  Result Date: 05/11/2018 CLINICAL DATA:  32 year old with chest pain and upper back pain. Concern for  acute aortic syndrome. EXAM: CT ANGIOGRAPHY CHEST WITH CONTRAST TECHNIQUE: Multidetector CT imaging of the chest was performed using the standard protocol during bolus administration of intravenous contrast. Multiplanar CT image reconstructions and MIPs were obtained to evaluate the vascular anatomy. CONTRAST:  100mL ISOVUE-370 IOPAMIDOL (ISOVUE-370) INJECTION 76% COMPARISON:  Chest radiograph 05/11/2018 FINDINGS: Cardiovascular: Normal caliber of the thoracic aorta without evidence for dissection or intramural hematoma. Typical aortic arch anatomy and the proximal great vessels  are patent. No evidence for central or large pulmonary embolism. Heart size is normal without significant pericardial fluid. Mediastinum/Nodes: Mild asymmetry of the thyroid tissue, right side greater than left. Evidence for thymic tissue. Esophagus is unremarkable. No mediastinal or hilar lymphadenopathy. Lungs/Pleura: Trachea and mainstem bronchi are patent. No pleural effusions. Lungs are clear without airspace disease or lung consolidation. Tiny pleural-based nodule along the right minor fissure on sequence 7, image 81. There is another pleural-based nodule along the right major fissure on sequence 7, image 76 measuring up to 5 mm. These nodule are likely incidental findings in a patient of this age. Upper Abdomen: Low-attenuation the liver with evidence of fatty sparing adjacent to the gallbladder. Musculoskeletal: No acute abnormality. Review of the MIP images confirms the above findings. IMPRESSION: No acute chest abnormality. No evidence for an aortic dissection or aneurysm. No evidence for a large or central pulmonary embolism. Hepatic steatosis. Two small pleural-based lung nodules. These are likely incidental findings for a patient of this age. Electronically Signed   By: Richarda Overlie M.D.   On: 05/11/2018 17:21    EKG: Sinus rhythm with right axis deviation  Weights: Filed Weights   05/11/18 1611 05/11/18 2325  Weight: 220  lb (99.8 kg) 226 lb 14.4 oz (102.9 kg)     Physical Exam: Blood pressure 120/78, pulse 84, temperature 98.2 F (36.8 C), temperature source Oral, resp. rate 17, height 5\' 9"  (1.753 m), weight 226 lb 14.4 oz (102.9 kg), SpO2 97 %. Body mass index is 33.51 kg/m. General: Well developed, well nourished, in no acute distress. Head eyes ears nose throat: Normocephalic, atraumatic, sclera non-icteric, no xanthomas, nares are without discharge. No apparent thyromegaly and/or mass  Lungs: Normal respiratory effort.  no wheezes, no rales, no rhonchi.  Heart: RRR with normal S1 S2. no murmur gallop, no rub, PMI is normal size and placement, carotid upstroke normal without bruit, jugular venous pressure is normal Abdomen: Soft, non-tender, non-distended with normoactive bowel sounds. No hepatomegaly. No rebound/guarding. No obvious abdominal masses. Abdominal aorta is normal size without bruit Extremities: No edema. no cyanosis, no clubbing, no ulcers  Peripheral : 2+ bilateral upper extremity pulses, 2+ bilateral femoral pulses, 2+ bilateral dorsal pedal pulse Neuro: Alert and oriented. No facial asymmetry. No focal deficit. Moves all extremities spontaneously. Musculoskeletal: Normal muscle tone without kyphosis Psych:  Responds to questions appropriately with a normal affect.    Assessment: 32 year old male with hyperlipidemia having atypical chest pain without evidence of myocardial infarction  Plan: 1.  Treadmill EKG to assess stress induced chest discomfort and risk stratification for atypical chest pain 2.  Continue high intensity cholesterol therapy 3.  Further treatment options after above  Signed, Lamar Blinks M.D. [facility] Cardiology 05/12/2018, 8:43 AM

## 2018-05-12 NOTE — Progress Notes (Signed)
*  PRELIMINARY RESULTS* Echocardiogram 2D Echocardiogram has been performed.  Tyler GulaJoan M Samel Pearson 05/12/2018, 9:45 AM

## 2018-05-13 LAB — HIV ANTIBODY (ROUTINE TESTING W REFLEX): HIV Screen 4th Generation wRfx: NONREACTIVE

## 2018-06-03 LAB — EXERCISE TOLERANCE TEST
CHL CUP RESTING HR STRESS: 102 {beats}/min
CSEPEDS: 58 s
CSEPHR: 87 %
Estimated workload: 10 METS
Exercise duration (min): 7 min
MPHR: 187 {beats}/min
Peak HR: 164 {beats}/min

## 2020-03-08 ENCOUNTER — Ambulatory Visit
Admission: EM | Admit: 2020-03-08 | Discharge: 2020-03-08 | Disposition: A | Discharge status: Home / Self Care | Attending: Family Medicine | Admitting: Family Medicine

## 2020-03-08 ENCOUNTER — Other Ambulatory Visit: Payer: Self-pay

## 2020-03-08 DIAGNOSIS — R112 Nausea with vomiting, unspecified: Secondary | ICD-10-CM

## 2020-03-08 DIAGNOSIS — R0789 Other chest pain: Secondary | ICD-10-CM

## 2020-03-08 DIAGNOSIS — K219 Gastro-esophageal reflux disease without esophagitis: Secondary | ICD-10-CM | POA: Diagnosis not present

## 2020-03-08 MED ORDER — PANTOPRAZOLE SODIUM 40 MG PO TBEC: 40.0000 mg | DELAYED_RELEASE_TABLET | Freq: Every day | ORAL | 1 refills | Status: DC

## 2020-03-08 NOTE — ED Provider Notes (Signed)
[facility]    CSN: [id number] Arrival date & time: 03/08/20  0851  History   Chief Complaint Chief Complaint  Patient presents with  . Emesis   HPI  34 year old male presents with several complaints.  Patient states that approximately 45 minutes prior to arrival he developed heartburn, chest heaviness/pressure.  This occurred while he was at work.  He reports that he had nausea and one episode of emesis.  After he vomited he felt better.  He states that he currently feels fatigued.  States that his chest still feels uncomfortable but does not really hurt.  He states that it is difficult for him to describe.  He was advised to come in for evaluation by his employer.  Currently pain-free.  No shortness of breath at this time.  No other complaints or concerns at this time.  Of note, patient had cardiac work-up and admission in 2019 and findings were normal.  Past Medical History:  Diagnosis Date  . Anxiety   . Back pain   . High cholesterol   . Non-alcoholic fatty liver disease   . Vertigo     Patient Active Problem List   Diagnosis Date Noted  . Chest pain 05/11/2018  . HLD (hyperlipidemia) 05/11/2018  . Anxiety 05/11/2018  . ACUTE PHARYNGITIS 04/28/2010  . HEADACHE 04/28/2010  . NAUSEA ALONE 04/28/2010  . BACK PAIN, THORACIC REGION 02/20/2009  . LUMBAR STRAIN 02/20/2009  . CONTUSION OF BACK 02/20/2009    Past Surgical History:  Procedure Laterality Date  . APPENDECTOMY         Home Medications    Prior to Admission medications   Medication Sig Start Date End Date Taking? Authorizing Provider  propranolol (INDERAL) 10 MG tablet Take 10 mg by mouth 3 (three) times daily.   Yes [provider]  atorvastatin (LIPITOR) 40 MG tablet Take 20 mg by mouth daily.    [provider]  fenofibrate (TRICOR) 145 MG tablet Take 145 mg by mouth at bedtime.     [provider]  lurasidone (LATUDA) 40 MG TABS tablet Take 20 mg by mouth at  bedtime.     [provider]  Melatonin 5 MG TABS Take 5 mg by mouth at bedtime as needed (sleep).    [provider]  naproxen (NAPROSYN) 375 MG tablet Take 375 mg by mouth 2 (two) times daily with a meal.    [provider]  Omega-3 Fatty Acids (FISH OIL) 1000 MG CAPS Take 2,000 mg by mouth daily.    [provider]  pantoprazole (PROTONIX) 40 MG tablet Take 1 tablet (40 mg total) by mouth daily. 03/08/20   Tommie Sams, DO  traMADol (ULTRAM) 50 MG tablet Take 50 mg by mouth 2 (two) times daily.    [provider]  venlafaxine XR (EFFEXOR XR) 150 MG 24 hr capsule Take 300 mg by mouth daily with breakfast.    [provider]  lithium carbonate 300 MG capsule Take 300 mg by mouth at bedtime.   03/08/20  [provider]    Family History Family History  Problem Relation Age of Onset  . Suicidality Mother   . Cirrhosis Father     Social History Social History   Tobacco Use  . Smoking status: Never Smoker  . Smokeless tobacco: Never Used  Substance Use Topics  . Alcohol use: Yes    Comment: rarely  . Drug use: Never     Allergies   Patient has  no known allergies.   Review of Systems Review of Systems  Cardiovascular: Positive for chest pain.  Gastrointestinal: Positive for nausea and vomiting.   Physical Exam Triage Vital Signs ED Triage Vitals  Enc Vitals Group     BP 03/08/20 0909 (!) 134/98     Pulse Rate 03/08/20 0909 76     Resp 03/08/20 0909 16     Temp 03/08/20 0909 98.2 F (36.8 C)     Temp Source 03/08/20 0909 Oral     SpO2 03/08/20 0909 100 %     Weight 03/08/20 0908 200 lb (90.7 kg)     Height 03/08/20 0908 5\' 9"  (1.753 m)     Head Circumference --      Peak Flow --      Pain Score 03/08/20 0908 0     Pain Loc --      Pain Edu? --      Excl. in [place]? --    Updated Vital Signs BP (!) 134/98 (BP Location: Right Arm)   Pulse 76   Temp 98.2 F (36.8 C) (Oral)   Resp 16   Ht 5\' 9"  (1.753 m)    Wt 90.7 kg   SpO2 100%   BMI 29.53 kg/m   Visual Acuity Right Eye Distance:   Left Eye Distance:   Bilateral Distance:    Right Eye Near:   Left Eye Near:    Bilateral Near:     Physical Exam Vitals and nursing note reviewed.  Constitutional:      General: He is not in acute distress.    Appearance: Normal appearance. He is not ill-appearing.  HENT:     Head: Normocephalic and atraumatic.  Eyes:     General:        Right eye: No discharge.        Left eye: No discharge.     Conjunctiva/sclera: Conjunctivae normal.  Cardiovascular:     Rate and Rhythm: Normal rate and regular rhythm.     Heart sounds: No murmur.  Pulmonary:     Effort: Pulmonary effort is normal.     Breath sounds: Normal breath sounds. No wheezing, rhonchi or rales.  Abdominal:     General: There is no distension.     Palpations: Abdomen is soft.     Tenderness: There is no abdominal tenderness.  Neurological:     Mental Status: He is alert.  Psychiatric:        Mood and Affect: Mood normal.        Behavior: Behavior normal.    UC Treatments / Results  Labs (all labs ordered are listed, but only abnormal results are displayed) Labs Reviewed - No data to display  EKG Interpretation: Normal sinus rhythm with a rate of 67.  Normal axis.  Normal intervals.  T wave inversion noted in aVF.  Unremarkable EKG.  Radiology No results found.  Procedures Procedures (including critical care time)  Medications Ordered in UC Medications - No data to display  Initial Impression / Assessment and Plan / UC Course  I have reviewed the triage vital signs and the nursing notes.  Pertinent labs & imaging results that were available during my care of the patient were reviewed by me and considered in my medical decision making (see chart for details).    34 year old male presents with chest pain and nausea and vomiting.  This appears to likely be from GERD.  EKG nonischemic.  Patient also takes NSAIDs  daily and  has been doing so for years.  Peptic ulcer disease and/or gastritis could be playing a role as well.  Placing on Protonix.  Supportive care.  Final Clinical Impressions(s) / UC Diagnoses   Final diagnoses:  Atypical chest pain  Gastroesophageal reflux disease without esophagitis  Non-intractable vomiting with nausea, unspecified vomiting type     Discharge Instructions     Rest.  Fluids.  Medication daily.  Take care  Dr. Adriana Simas    ED Prescriptions    Medication Sig Dispense Auth. Provider   pantoprazole (PROTONIX) 40 MG tablet Take 1 tablet (40 mg total) by mouth daily. 90 tablet Everlene Other G, DO     PDMP not reviewed this encounter.   Everlene Other [place], [place] 03/08/20 [phone]

## 2020-03-08 NOTE — Discharge Instructions (Signed)
Rest.  Fluids.  Medication daily.  Take care  Dr. Adriana Simas

## 2020-03-08 NOTE — ED Triage Notes (Signed)
Pt reports feeling heartburn and chest heaviness about 45 min PTA, nausea, vomiting and dizziness. After he vomited he felt better. Has had similar in the past. Had full cardiac work up a year ago for same.

## 2020-06-04 ENCOUNTER — Ambulatory Visit
Admission: EM | Admit: 2020-06-04 | Discharge: 2020-06-04 | Disposition: A | Discharge status: Home / Self Care | Attending: Family Medicine | Admitting: Family Medicine

## 2020-06-04 ENCOUNTER — Other Ambulatory Visit: Payer: Self-pay

## 2020-06-04 DIAGNOSIS — F419 Anxiety disorder, unspecified: Secondary | ICD-10-CM | POA: Insufficient documentation

## 2020-06-04 DIAGNOSIS — Z791 Long term (current) use of non-steroidal anti-inflammatories (NSAID): Secondary | ICD-10-CM | POA: Insufficient documentation

## 2020-06-04 DIAGNOSIS — K760 Fatty (change of) liver, not elsewhere classified: Secondary | ICD-10-CM | POA: Insufficient documentation

## 2020-06-04 DIAGNOSIS — E7800 Pure hypercholesterolemia, unspecified: Secondary | ICD-10-CM | POA: Diagnosis not present

## 2020-06-04 DIAGNOSIS — Z79899 Other long term (current) drug therapy: Secondary | ICD-10-CM | POA: Diagnosis not present

## 2020-06-04 DIAGNOSIS — B349 Viral infection, unspecified: Secondary | ICD-10-CM | POA: Diagnosis present

## 2020-06-04 DIAGNOSIS — Z20822 Contact with and (suspected) exposure to covid-19: Secondary | ICD-10-CM | POA: Diagnosis not present

## 2020-06-04 DIAGNOSIS — E785 Hyperlipidemia, unspecified: Secondary | ICD-10-CM | POA: Diagnosis not present

## 2020-06-04 DIAGNOSIS — Z9049 Acquired absence of other specified parts of digestive tract: Secondary | ICD-10-CM | POA: Diagnosis not present

## 2020-06-04 LAB — SARS CORONAVIRUS 2 (TAT 6-24 HRS): SARS Coronavirus 2: NEGATIVE

## 2020-06-04 MED ORDER — BENZONATATE 200 MG PO CAPS: 200.0000 mg | ORAL_CAPSULE | Freq: Three times a day (TID) | ORAL | 0 refills | Status: DC | PRN

## 2020-06-04 NOTE — ED Provider Notes (Signed)
[facility]    CSN: [id number] Arrival date & time: 06/04/20  0854      History   Chief Complaint Chief Complaint  Patient presents with  . Cough  . Covid Exposure   HPI  34 year old male presents with the above complaints.  Patient reports that he has had a coworker who has recently tested positive for Covid.  Patient reports that he developed sore throat on Saturday.  Now has a cough.  He states that the cough kept him up last night.  No fever.  No relieving factors.  No reports of shortness of breath.  He states that his work is requiring him to get tested for COVID-19.  No other associated symptoms.  No other complaints.  Past Medical History:  Diagnosis Date  . Anxiety   . Back pain   . High cholesterol   . Non-alcoholic fatty liver disease   . Vertigo     Patient Active Problem List   Diagnosis Date Noted  . Chest pain 05/11/2018  . HLD (hyperlipidemia) 05/11/2018  . Anxiety 05/11/2018  . ACUTE PHARYNGITIS 04/28/2010  . HEADACHE 04/28/2010  . NAUSEA ALONE 04/28/2010  . BACK PAIN, THORACIC REGION 02/20/2009  . LUMBAR STRAIN 02/20/2009  . CONTUSION OF BACK 02/20/2009    Past Surgical History:  Procedure Laterality Date  . APPENDECTOMY         Home Medications    Prior to Admission medications   Medication Sig Start Date End Date Taking? Authorizing Provider  atorvastatin (LIPITOR) 40 MG tablet Take 20 mg by mouth daily.   Yes [provider]  fenofibrate (TRICOR) 145 MG tablet Take 145 mg by mouth at bedtime.    Yes [provider]  lurasidone (LATUDA) 40 MG TABS tablet Take 20 mg by mouth at bedtime.    Yes [provider]  Melatonin 5 MG TABS Take 5 mg by mouth at bedtime as needed (sleep).   Yes [provider]  naproxen (NAPROSYN) 375 MG tablet Take 375 mg by mouth 2 (two) times daily with a meal.   Yes [provider]  Omega-3 Fatty Acids (FISH OIL) 1000 MG CAPS Take 2,000 mg by mouth daily.    Yes [provider]  pantoprazole (PROTONIX) 40 MG tablet Take 1 tablet (40 mg total) by mouth daily. 03/08/20  Yes Pedrohenrique [place] G, DO  propranolol (INDERAL) 10 MG tablet Take 10 mg by mouth 3 (three) times daily.   Yes [provider]  traMADol (ULTRAM) 50 MG tablet Take 50 mg by mouth 2 (two) times daily.   Yes [provider]  venlafaxine XR (EFFEXOR XR) 150 MG 24 hr capsule Take 300 mg by mouth daily with breakfast.   Yes [provider]  benzonatate (TESSALON) 200 MG capsule Take 1 capsule (200 mg total) by mouth 3 (three) times daily as needed for cough. 06/04/20   Tommie Sams, DO  lithium carbonate 300 MG capsule Take 300 mg by mouth at bedtime.   03/08/20  [provider]    Family History Family History  Problem Relation Age of Onset  . Suicidality Mother   . Cirrhosis Father     Social History Social History   Tobacco Use  . Smoking status: Never Smoker  . Smokeless tobacco: Never Used  Vaping Use  . Vaping Use: Never used  Substance Use Topics  . Alcohol use: Yes    Comment: rarely  . Drug use: Never  Allergies   Patient has no known allergies.   Review of Systems Review of Systems  Constitutional: Negative for fever.  HENT: Positive for sore throat.   Respiratory: Positive for cough.    Physical Exam Triage Vital Signs ED Triage Vitals  Enc Vitals Group     BP 06/04/20 0922 129/82     Pulse Rate 06/04/20 0922 87     Resp 06/04/20 0922 18     Temp 06/04/20 0922 98.2 F (36.8 C)     Temp Source 06/04/20 0922 Oral     SpO2 06/04/20 0922 100 %     Weight 06/04/20 0921 188 lb (85.3 kg)     Height 06/04/20 0921 5\' 9"  (1.753 m)     Head Circumference --      Peak Flow --      Pain Score 06/04/20 0920 2     Pain Loc --      Pain Edu? --      Excl. in [place]? --    Updated Vital Signs BP 129/82 (BP Location: Left Arm)   Pulse 87   Temp 98.2 F (36.8 C) (Oral)   Resp 18   Ht 5\' 9"  (1.753 m)   Wt 85.3 kg    SpO2 100%   BMI 27.76 kg/m   Visual Acuity Right Eye Distance:   Left Eye Distance:   Bilateral Distance:    Right Eye Near:   Left Eye Near:    Bilateral Near:     Physical Exam Vitals and nursing note reviewed.  Constitutional:      General: He is not in acute distress.    Appearance: Normal appearance. He is not ill-appearing.  HENT:     Head: Normocephalic and atraumatic.     Mouth/Throat:     Pharynx: Oropharynx is clear. No posterior oropharyngeal erythema.  Eyes:     General:        Right eye: No discharge.        Left eye: No discharge.     Conjunctiva/sclera: Conjunctivae normal.  Cardiovascular:     Rate and Rhythm: Normal rate and regular rhythm.     Heart sounds: No murmur heard.   Pulmonary:     Effort: Pulmonary effort is normal.     Breath sounds: Normal breath sounds. No wheezing, rhonchi or rales.  Neurological:     Mental Status: He is alert.  Psychiatric:        Mood and Affect: Mood normal.        Behavior: Behavior normal.    UC Treatments / Results  Labs (all labs ordered are listed, but only abnormal results are displayed) Labs Reviewed  SARS CORONAVIRUS 2 (TAT 6-24 HRS)    EKG   Radiology No results found.  Procedures Procedures (including critical care time)  Medications Ordered in UC Medications - No data to display  Initial Impression / Assessment and Plan / UC Course  I have reviewed the triage vital signs and the nursing notes.  Pertinent labs & imaging results that were available during my care of the patient were reviewed by me and considered in my medical decision making (see chart for details).    34 year old male presents with sore throat and cough.  Suspect this is viral in origin.  Awaiting Covid test results.  Tessalon Perles for cough.  Final Clinical Impressions(s) / UC Diagnoses   Final diagnoses:  Viral illness     Discharge Instructions     Results available  tomorrow.  Stay home.  Cough  medication as directed.   Take care  Dr. Adriana Simas     ED Prescriptions    Medication Sig Dispense Auth. Provider   benzonatate (TESSALON) 200 MG capsule Take 1 capsule (200 mg total) by mouth 3 (three) times daily as needed for cough. 30 capsule Tommie Sams, DO     PDMP not reviewed this encounter.   Tommie Sams, [place] 06/04/20 1007

## 2020-06-04 NOTE — Discharge Instructions (Signed)
Results available tomorrow.  Stay home.  Cough medication as directed.   Take care  Dr. Adriana Simas

## 2020-06-04 NOTE — ED Triage Notes (Signed)
Patient states that he was exposed to Covid by a co-worker on Wednesday. Patient states that he has a cough that started over the last 2 days. States that he was told by his employer to come get tested.

## 2020-10-05 ENCOUNTER — Other Ambulatory Visit: Payer: Self-pay

## 2020-10-05 ENCOUNTER — Emergency Department

## 2020-10-05 ENCOUNTER — Emergency Department
Admission: EM | Admit: 2020-10-05 | Discharge: 2020-10-05 | Disposition: A | Discharge status: Home / Self Care | Attending: Emergency Medicine | Admitting: Emergency Medicine

## 2020-10-05 DIAGNOSIS — R569 Unspecified convulsions: Secondary | ICD-10-CM | POA: Insufficient documentation

## 2020-10-05 LAB — CBC WITH DIFFERENTIAL/PLATELET
Abs Immature Granulocytes: 0.03 10*3/uL (ref 0.00–0.07)
Basophils Absolute: 0.1 10*3/uL (ref 0.0–0.1)
Basophils Relative: 1 %
Eosinophils Absolute: 0.2 10*3/uL (ref 0.0–0.5)
Eosinophils Relative: 3 %
HCT: 43.3 % (ref 39.0–52.0)
Hemoglobin: 15.3 g/dL (ref 13.0–17.0)
Immature Granulocytes: 0 %
Lymphocytes Relative: 25 %
Lymphs Abs: 1.8 10*3/uL (ref 0.7–4.0)
MCH: 31 pg (ref 26.0–34.0)
MCHC: 35.3 g/dL (ref 30.0–36.0)
MCV: 87.7 fL (ref 80.0–100.0)
Monocytes Absolute: 0.5 10*3/uL (ref 0.1–1.0)
Monocytes Relative: 7 %
Neutro Abs: 4.4 10*3/uL (ref 1.7–7.7)
Neutrophils Relative %: 64 %
Platelets: 219 10*3/uL (ref 150–400)
RBC: 4.94 MIL/uL (ref 4.22–5.81)
RDW: 12.6 % (ref 11.5–15.5)
WBC: 7 10*3/uL (ref 4.0–10.5)
nRBC: 0 % (ref 0.0–0.2)

## 2020-10-05 LAB — BASIC METABOLIC PANEL
Anion gap: 9 (ref 5–15)
BUN: 10 mg/dL (ref 6–20)
CO2: 25 mmol/L (ref 22–32)
Calcium: 9.5 mg/dL (ref 8.9–10.3)
Chloride: 99 mmol/L (ref 98–111)
Creatinine, Ser: 0.87 mg/dL (ref 0.61–1.24)
GFR, Estimated: >60 mL/min (ref >60)
Glucose, Bld: 106 mg/dL — ABNORMAL HIGH (ref 70–99)
Potassium: 3.4 mmol/L — ABNORMAL LOW (ref 3.5–5.1)
Sodium: 133 mmol/L — ABNORMAL LOW (ref 135–145)

## 2020-10-05 IMAGING — CT CT HEAD W/O CM
3 series · 15 of 45 positions shown, 18 images · non-contrast
Comparison: None.

CLINICAL DATA: Wife states pt started licking lips repeatedly, pt
phone was ringing and he did not realize it, was staring. Pt states
he felt like he was in a fog, states does not feel the same.

EXAM:
CT HEAD WITHOUT CONTRAST
TECHNIQUE: Contiguous axial images were obtained from the base of the skull
through the vertex without intravenous contrast.

[Series 3: head wo · axial · 0.46mm/px · z∈[-109,+6]mm · 9 of 28 slices shown, 12 images]
[im 3/28  brain]
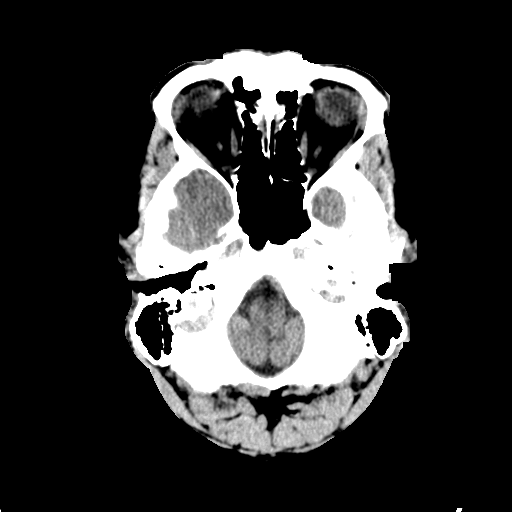
[im 3/28  bone]
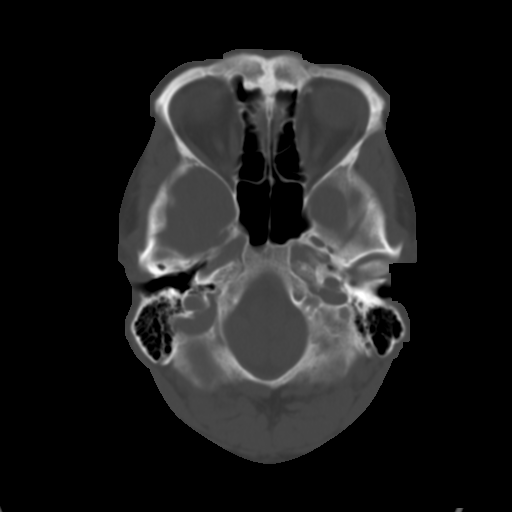
[im 6/28  brain]
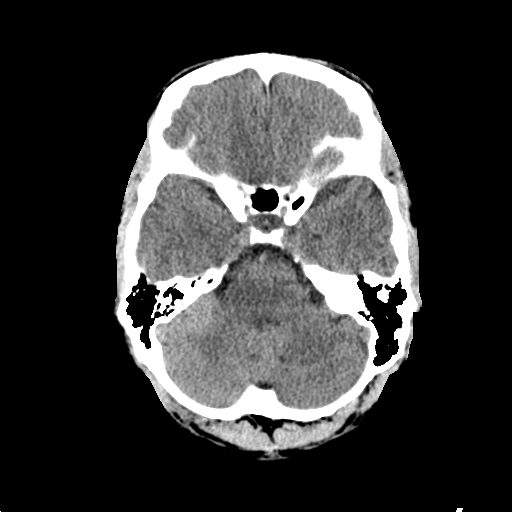
[im 9/28  brain]
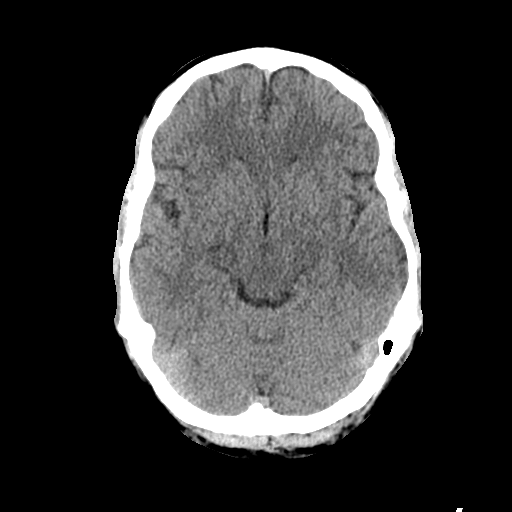
[im 12/28  brain]
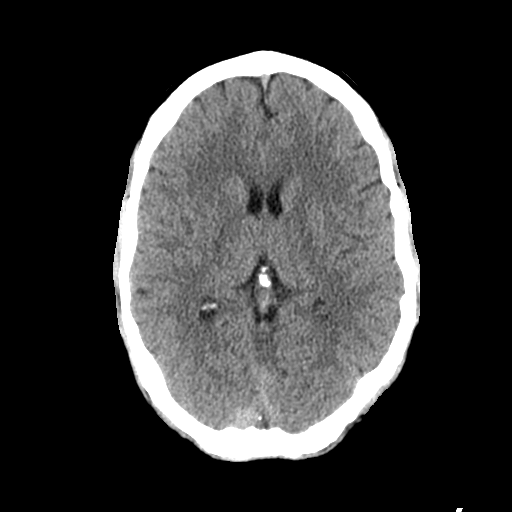
[im 15/28  brain]
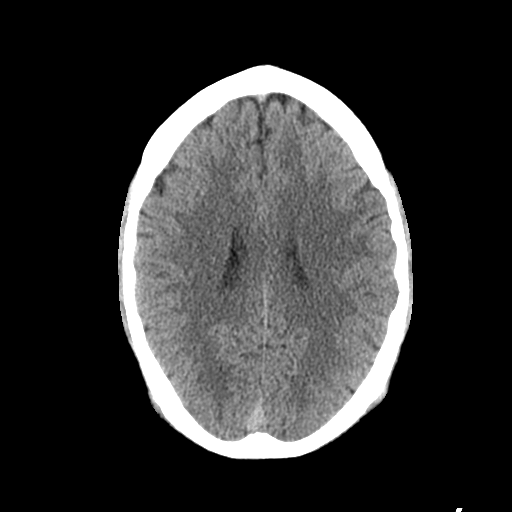
[im 15/28  bone]
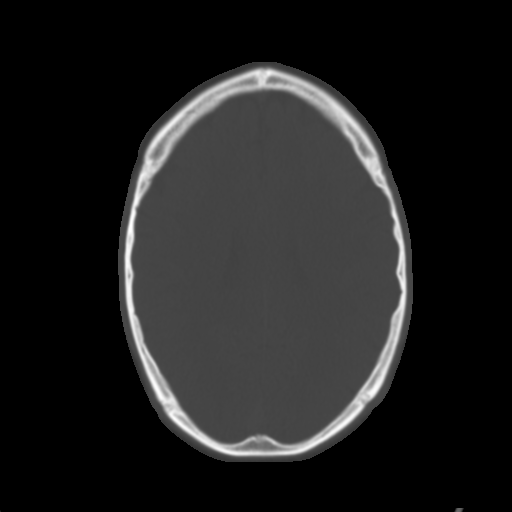
[im 17/28  brain]
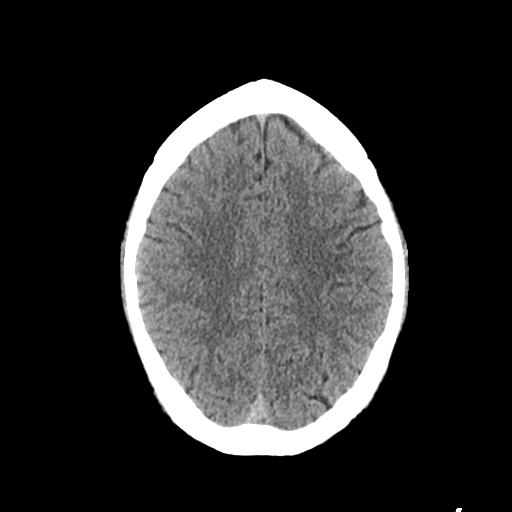
[im 20/28  brain]
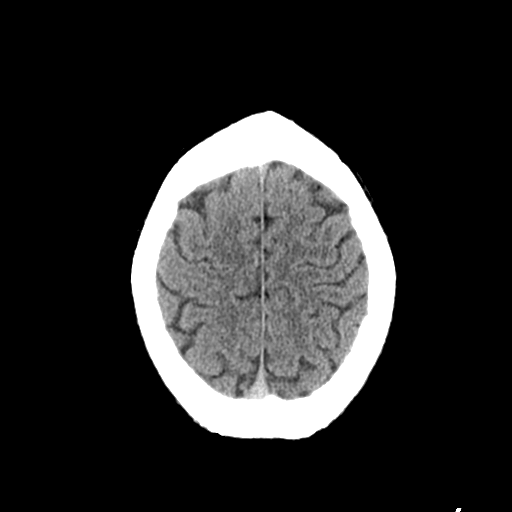
[im 23/28  brain]
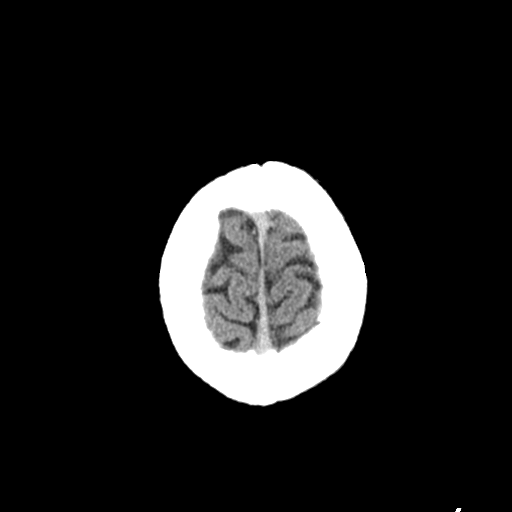
[im 26/28  brain]
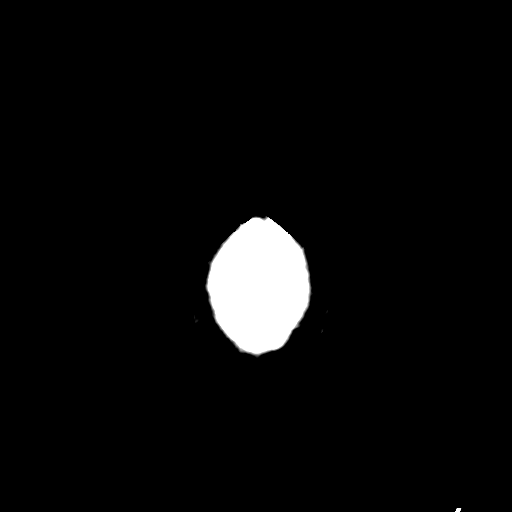
[im 26/28  bone]
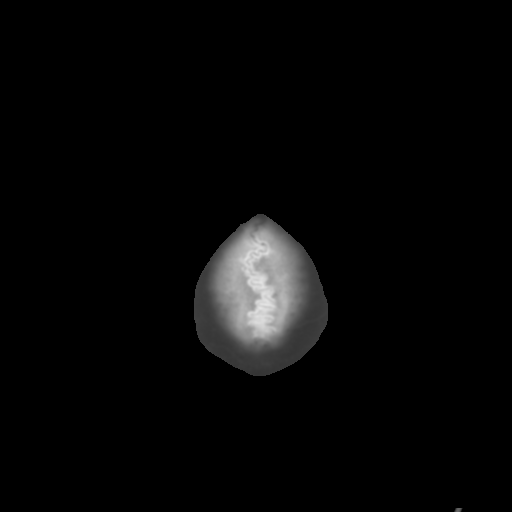

[Series 4: coronal soft tissue · coronal · 0.31mm/px · 3 of 67 slices shown]
[im 23/67  brain]
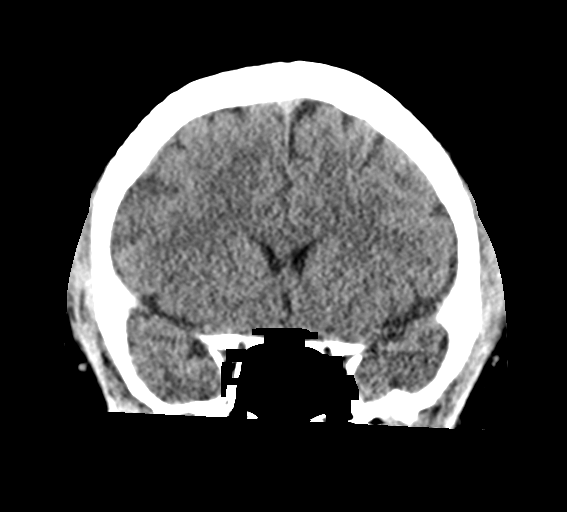
[im 30/67  brain]
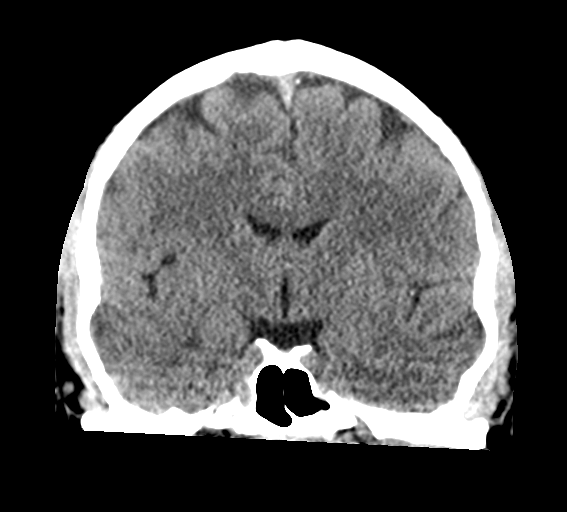
[im 37/67  brain]
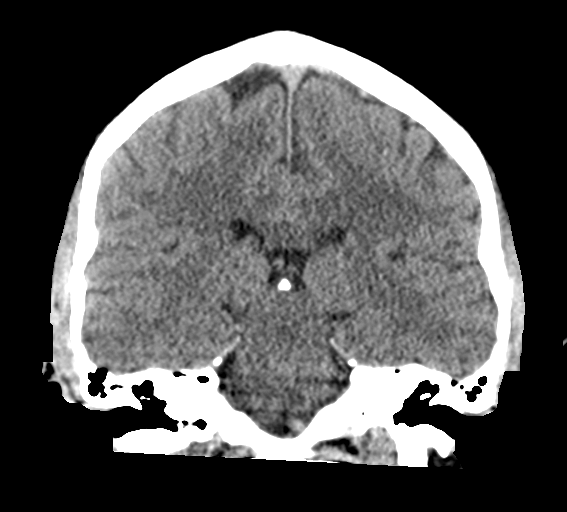

[Series 5: sagittal soft tissue · sagittal · 0.33mm/px · 3 of 54 slices shown]
[im 18/54  brain]
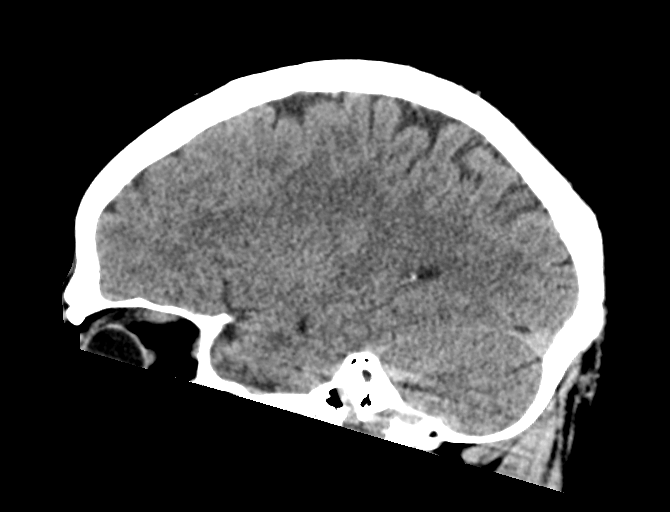
[im 27/54  brain]
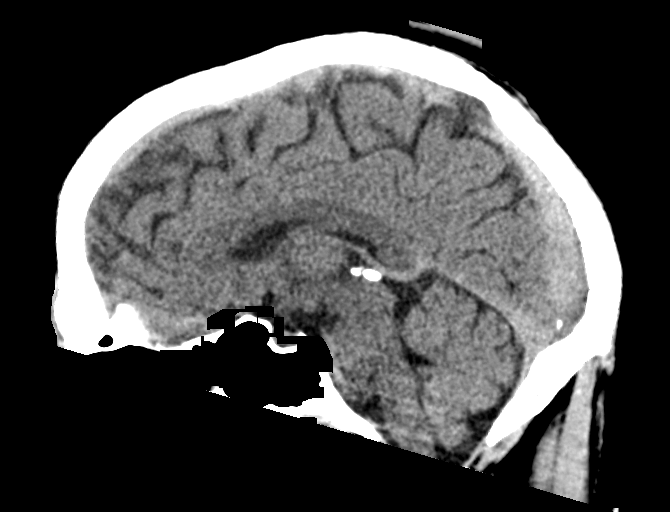
[im 36/54  brain]
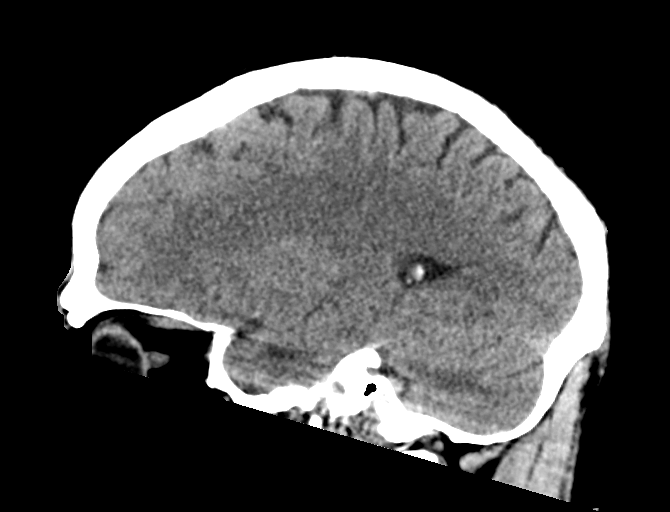

[15 of 45 positions shown; findings below may reference images not displayed]

FINDINGS: Brain: No evidence of acute infarction, hemorrhage, hydrocephalus,
extra-axial collection or mass lesion/mass effect.

Vascular: No hyperdense vessel or unexpected calcification.

Skull: Normal. Negative for fracture or focal lesion.

Sinuses/Orbits: Visualized globes and orbits are unremarkable. The
visualized sinuses are clear.

Other: None.
IMPRESSION: Normal unenhanced CT scan of the brain.

## 2020-10-05 NOTE — ED Provider Notes (Signed)
[facility] Emergency Department Provider Note   ____________________________________________    I have reviewed the triage vital signs and the nursing notes.   HISTORY  Chief Complaint Seizures     HPI Tyler Pearson is a 34 y.o. male who presents after possible seizure-like activity.  Reportedly patient was not responding to wife and was ignoring his phone ringing which is atypical for him.  She noted that he was licking his lips and staring off into the distance.  She pulled on his hand and he seemed to improve.  No history of similar episode.  No neuro deficits, no headache.  Denies drug use.  No new medications.  Feels well currently and has no  Past Medical History:  Diagnosis Date  . Anxiety   . Back pain   . High cholesterol   . Non-alcoholic fatty liver disease   . Vertigo     Patient Active Problem List   Diagnosis Date Noted  . Chest pain 05/11/2018  . HLD (hyperlipidemia) 05/11/2018  . Anxiety 05/11/2018  . ACUTE PHARYNGITIS 04/28/2010  . HEADACHE 04/28/2010  . NAUSEA ALONE 04/28/2010  . BACK PAIN, THORACIC REGION 02/20/2009  . LUMBAR STRAIN 02/20/2009  . CONTUSION OF BACK 02/20/2009    Past Surgical History:  Procedure Laterality Date  . APPENDECTOMY      Prior to Admission medications   Medication Sig Start Date End Date Taking? Authorizing Provider  atorvastatin (LIPITOR) 40 MG tablet Take 20 mg by mouth daily.    [provider]  benzonatate (TESSALON) 200 MG capsule Take 1 capsule (200 mg total) by mouth 3 (three) times daily as needed for cough. 06/04/20   Tommie Sams, DO  fenofibrate (TRICOR) 145 MG tablet Take 145 mg by mouth at bedtime.     [provider]  lurasidone (LATUDA) 40 MG TABS tablet Take 20 mg by mouth at bedtime.     [provider]  Melatonin 5 MG TABS Take 5 mg by mouth at bedtime as needed (sleep).    [provider]  naproxen (NAPROSYN) 375 MG tablet Take 375 mg by  mouth 2 (two) times daily with a meal.    [provider]  Omega-3 Fatty Acids (FISH OIL) 1000 MG CAPS Take 2,000 mg by mouth daily.    [provider]  pantoprazole (PROTONIX) 40 MG tablet Take 1 tablet (40 mg total) by mouth daily. 03/08/20   Tommie Sams, DO  propranolol (INDERAL) 10 MG tablet Take 10 mg by mouth 3 (three) times daily.    [provider]  traMADol (ULTRAM) 50 MG tablet Take 50 mg by mouth 2 (two) times daily.    [provider]  venlafaxine XR (EFFEXOR XR) 150 MG 24 hr capsule Take 300 mg by mouth daily with breakfast.    [provider]  lithium carbonate 300 MG capsule Take 300 mg by mouth at bedtime.   03/08/20  [provider]     Allergies Patient has no known allergies.  Family History  Problem Relation Age of Onset  . Suicidality Mother   . Cirrhosis Father     Social History Social History   Tobacco Use  . Smoking status: Never Smoker  . Smokeless tobacco: Never Used  Vaping Use  . Vaping Use: Never used  Substance Use Topics  . Alcohol use: Yes    Comment: rarely  . Drug use: Never    Review of Systems  Constitutional: No fever/chills  Eyes: No visual changes.  ENT: No sore throat. Cardiovascular: Denies chest pain. Respiratory: Denies shortness of breath. Gastrointestinal: No abdominal pain.  No nausea, no vomiting.   Genitourinary: Negative for dysuria. Musculoskeletal: Negative for back pain. Skin: Negative for rash. Neurological: As above   ____________________________________________   PHYSICAL EXAM:  VITAL SIGNS: ED Triage Vitals  Enc Vitals Group     BP 10/05/20 1236 (!) 139/96     Pulse Rate 10/05/20 1236 71     Resp 10/05/20 1236 18     Temp 10/05/20 1236 99 F (37.2 C)     Temp Source 10/05/20 1236 Oral     SpO2 10/05/20 1236 100 %     Weight 10/05/20 1237 85.3 kg (188 lb)     Height 10/05/20 1237 1.753 m (5\' 9" )     Head Circumference --      Peak Flow --       Pain Score 10/05/20 1237 0     Pain Loc --      Pain Edu? --      Excl. in GC? --     Constitutional: Alert and oriented. No acute distress. Pleasant and interactive Eyes: Conjunctivae are normal.  PERRLA, EOMI Head: Atraumatic. Nose: No congestion/rhinnorhea. Mouth/Throat: Mucous membranes are moist.   Cardiovascular: Normal rate, regular rhythm. Grossly normal heart sounds.  Good peripheral circulation. Respiratory: Normal respiratory effort.  No retractions. Lungs CTAB. Gastrointestinal: Soft and nontender. No distention.    Musculoskeletal: No lower extremity tenderness nor edema.  Warm and well perfused Neurologic:  Normal speech and language. No gross focal neurologic deficits are appreciated.  Cranial nerves II through XII are normal Skin:  Skin is warm, dry and intact. No rash noted. Psychiatric: Mood and affect are normal. Speech and behavior are normal.  ____________________________________________   LABS (all labs ordered are listed, but only abnormal results are displayed)  Labs Reviewed  BASIC METABOLIC PANEL - Abnormal; Notable for the following components:      Result Value   Sodium 133 (*)    Potassium 3.4 (*)    Glucose, Bld 106 (*)    All other components within normal limits  CBC WITH DIFFERENTIAL/PLATELET   ____________________________________________  EKG  ED ECG REPORT I, 14/10/21, the attending physician, personally viewed and interpreted this ECG.  Date: 10/05/2020  Rhythm: normal sinus rhythm QRS Axis: normal Intervals: normal ST/T Wave abnormalities: normal Narrative Interpretation: no evidence of acute ischemia  ____________________________________________  RADIOLOGY  CT head reviewed by me, no acute abnormality ____________________________________________   PROCEDURES  Procedure(s) performed: No  Procedures   Critical Care performed: No ____________________________________________   INITIAL IMPRESSION / ASSESSMENT  AND PLAN / ED COURSE  Pertinent labs & imaging results that were available during my care of the patient were reviewed by me and considered in my medical decision making (see chart for details).  Patient presents with episode as detailed above.  Unclear cause of episode, atypical/partial seizure is possible.  Patient is well-appearing at this time has no complaints  Imaging is reassuring, lab work is unremarkable.  Observed in the emergency department, no further seizure-like activity, will have him follow-up closely with neurology for further evaluation    ____________________________________________   FINAL CLINICAL IMPRESSION(S) / ED DIAGNOSES  Final diagnoses:  Seizure-like activity (HCC)        Note:  This document was prepared using Dragon voice recognition software and may include unintentional dictation errors.   14/07/2020, MD 10/05/20 [phone]

## 2020-10-05 NOTE — ED Notes (Signed)
Pt returned from CT °

## 2020-10-05 NOTE — ED Notes (Signed)
First encounter to D/C. Pt A&Ox4. No distress noted. D/C discussed with pt and neuro f/up, pt verbalized understanding.

## 2020-10-05 NOTE — ED Triage Notes (Signed)
Pt to ED POV for possible seizure. Wife states pt started licking lips repeatedly, pt phone was ringing and he did not realize it, was staring. Pt states he felt like he was in a fog, states does not feel the same.  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 No obvious deformities

## 2020-10-10 ENCOUNTER — Other Ambulatory Visit: Payer: Self-pay | Admitting: Neurology

## 2020-10-10 DIAGNOSIS — G40109 Localization-related (focal) (partial) symptomatic epilepsy and epileptic syndromes with simple partial seizures, not intractable, without status epilepticus: Secondary | ICD-10-CM

## 2020-10-14 ENCOUNTER — Other Ambulatory Visit: Payer: Self-pay

## 2020-10-14 ENCOUNTER — Ambulatory Visit
Admission: RE | Admit: 2020-10-14 | Discharge: 2020-10-14 | Disposition: A | Discharge status: Home / Self Care | Source: Ambulatory Visit | Attending: Neurology | Admitting: Neurology

## 2020-10-14 DIAGNOSIS — G40109 Localization-related (focal) (partial) symptomatic epilepsy and epileptic syndromes with simple partial seizures, not intractable, without status epilepticus: Secondary | ICD-10-CM | POA: Diagnosis present

## 2020-10-14 IMAGING — MR MR HEAD WO/W CM
15 of 16 series · 43 of 48 positions shown · IV contrast (gadavist)
Comparison: None.

CLINICAL DATA: Temporal lobe seizure.

EXAM:
MRI HEAD WITHOUT AND WITH CONTRAST
TECHNIQUE: Multiplanar, multiecho pulse sequences of the brain and surrounding
structures were obtained without and with intravenous contrast.
CONTRAST:  9mL GADAVIST GADOBUTROL 1 MMOL/ML IV SOLN

[Series 6: ax dwi_tracew · axial · 3.0mm · 0.60mm/px · z∈[-54,+96]mm · 5 of 96 slices shown]
[im 1/96]
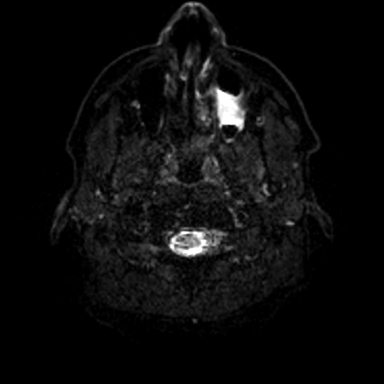
[im 24/96]
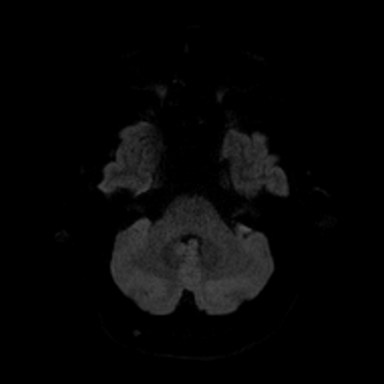
[im 48/96]
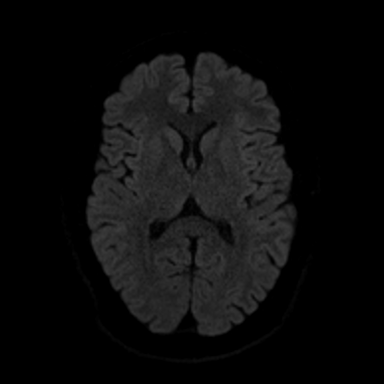
[im 72/96]
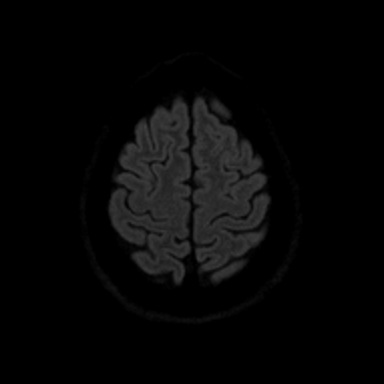
[im 96/96]
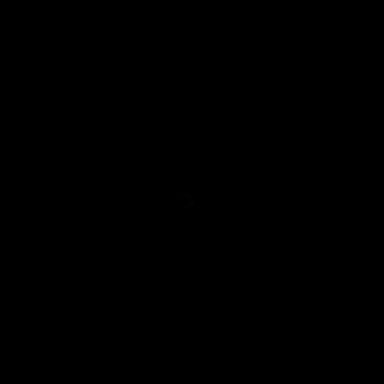

[Series 7: ax dwi_adc · axial · 3.0mm · 0.60mm/px · z∈[-54,+93]mm · 3 of 47 slices shown]
[im 1/47]
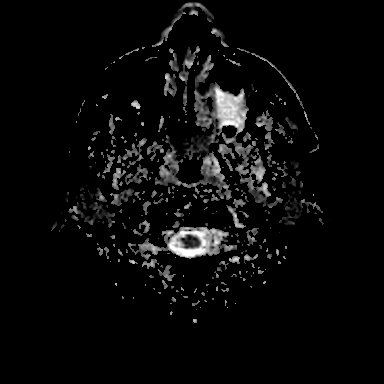
[im 24/47]
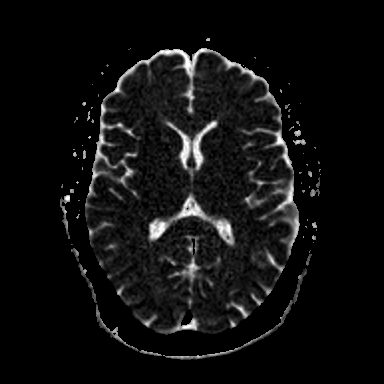
[im 47/47]
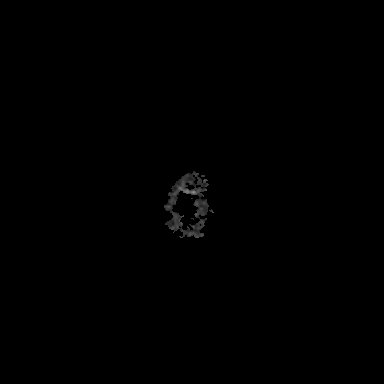

[Series 8: cor dwi_tracew · coronal · 5.0mm · 0.60mm/px · 3 of 76 slices shown]
[im 1/76]
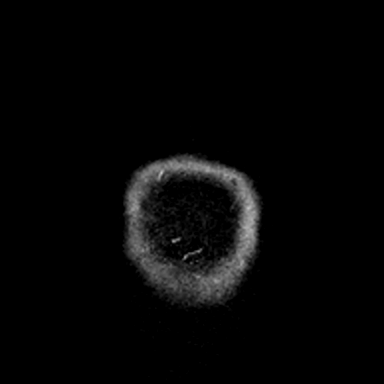
[im 38/76]
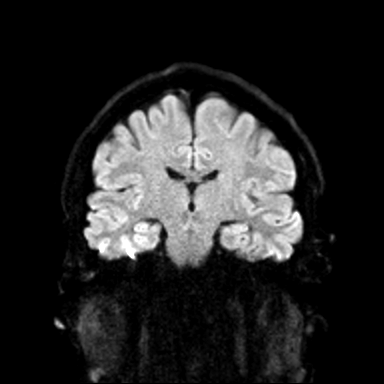
[im 76/76]
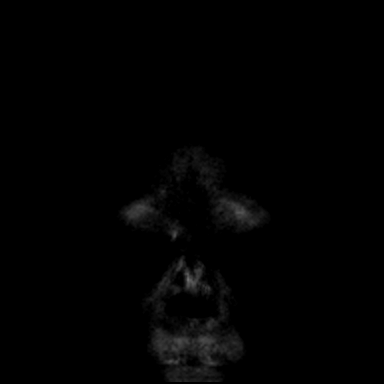

[Series 9: cor dwi_adc · coronal · 5.0mm · 0.60mm/px · 2 of 38 slices shown]
[im 1/38]
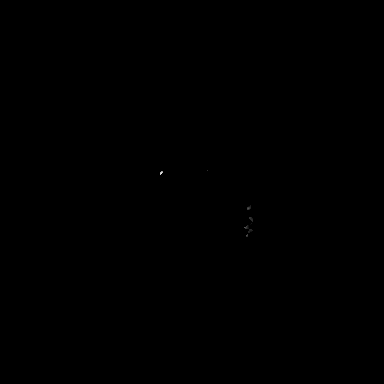
[im 38/38]
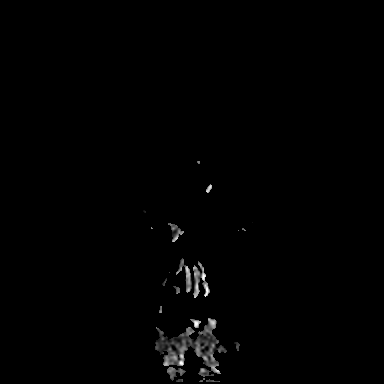

[Series 10: T2 · axial · 5.0mm · 0.53mm/px · 1 of 25 slices shown (1 of 2)]
[im 1/25]
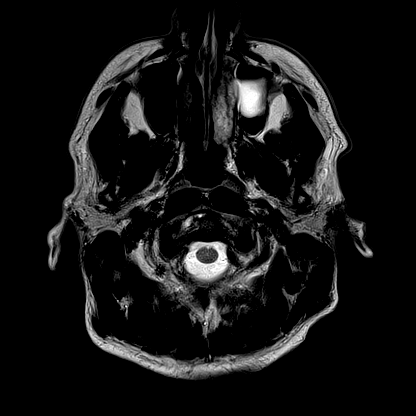

[Series 11: mag_images · axial · 3.0mm · 0.90mm/px · z∈[-71,+103]mm · 3 of 60 slices shown]
[im 1/60]
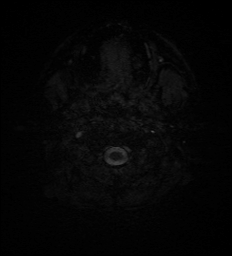
[im 30/60]
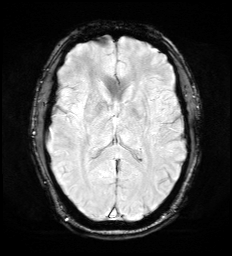
[im 60/60]
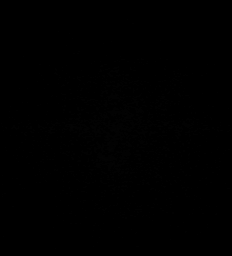

[Series 12: pha_images · axial · 3.0mm · 0.90mm/px · z∈[-71,+103]mm · 2 of 59 slices shown]
[im 1/59]
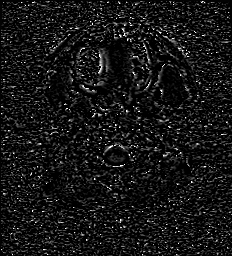
[im 59/59]
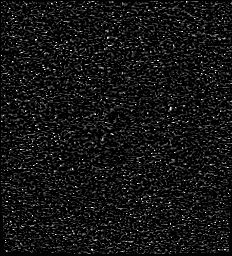

[Series 13: swi_images · axial · 3.0mm · 0.90mm/px · z∈[-71,+103]mm · 3 of 60 slices shown]
[im 1/60]
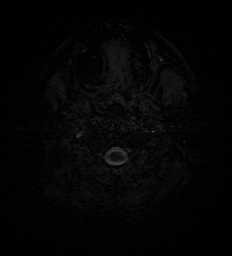
[im 30/60]
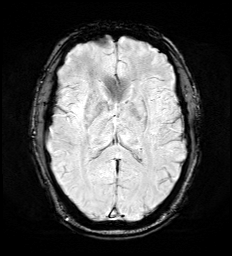
[im 60/60]
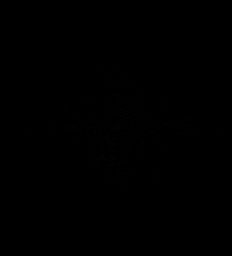

[Series 14: mip_images(sw) · axial · 24.0mm · 0.90mm/px · z∈[-60,+93]mm · 2 of 53 slices shown]
[im 1/53]
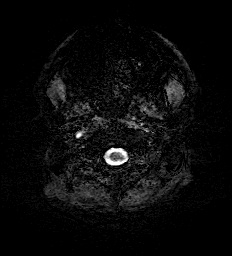
[im 53/53]
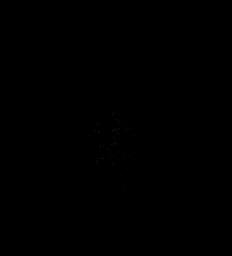

[Series 15: FLAIR · axial · 3.0mm · 0.53mm/px · z∈[-64,+95]mm · 2 of 55 slices shown (1 of 2)]
[im 1/55]
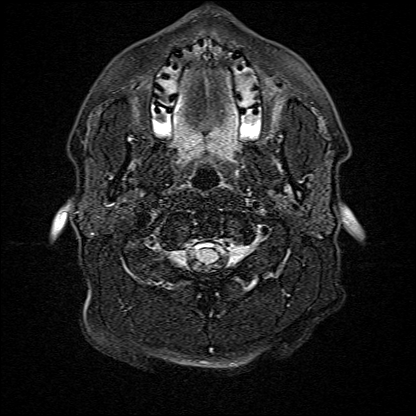
[im 55/55]
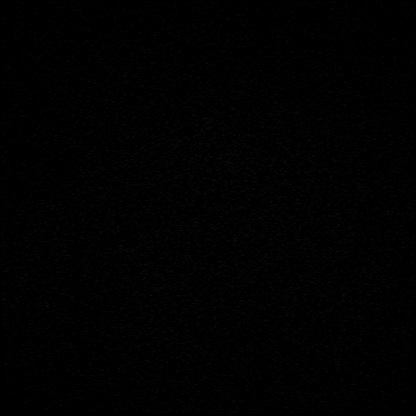

[Series 16: T1 · axial · 1.0mm · 0.98mm/px · z∈[-68,+104]mm · 7 of 176 slices shown]
[im 1/176]
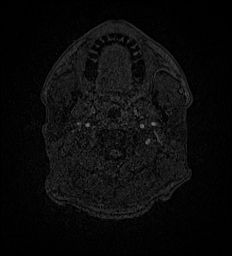
[im 30/176]
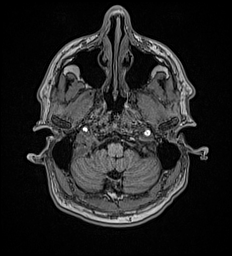
[im 59/176]
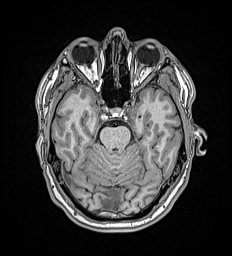
[im 88/176]
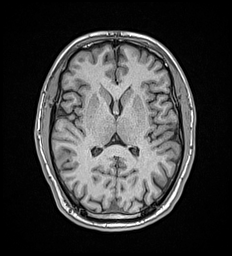
[im 117/176]
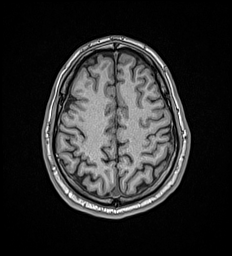
[im 146/176]
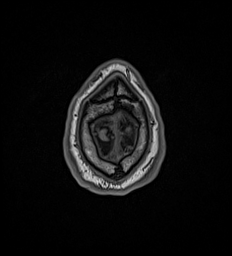
[im 176/176]
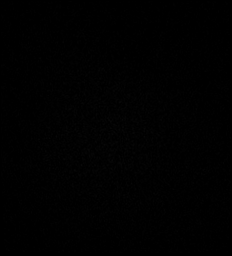

[Series 17: T2 · coronal · 3.0mm · 0.23mm/px · 1 of 35 slices shown (2 of 2)]
[im 1/35]
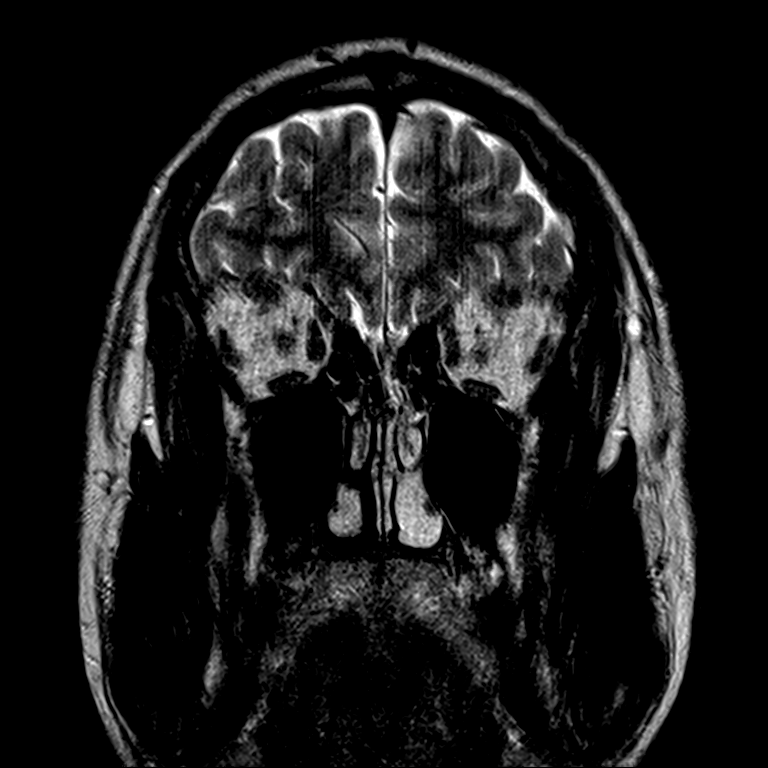

[Series 18: FLAIR · coronal · 3.0mm · 0.35mm/px · 1 of 35 slices shown (2 of 2)]
[im 1/35]
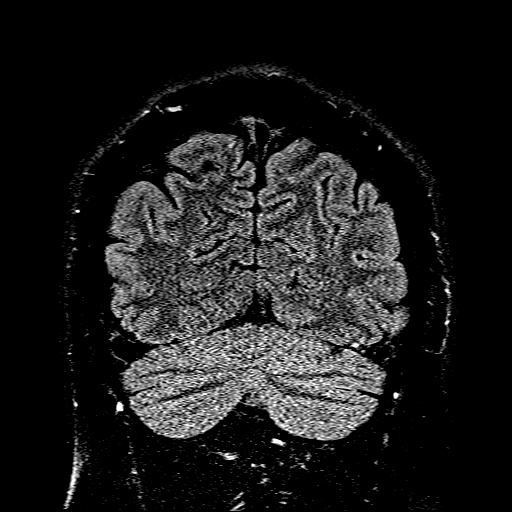

[Series 19: T1 post-contrast · axial · 1.0mm · 0.98mm/px · z∈[-68,+104]mm · 7 of 176 slices shown (1 of 2)]
[im 1/176]
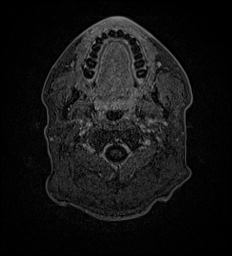
[im 30/176]
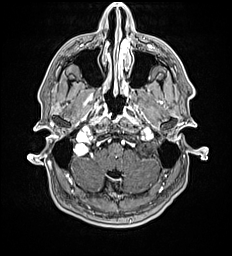
[im 59/176]
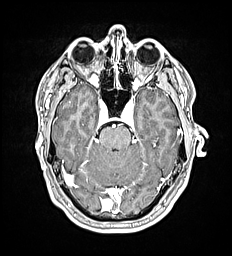
[im 88/176]
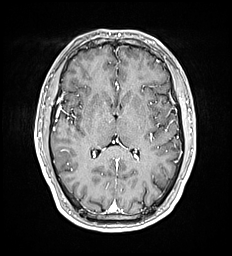
[im 117/176]
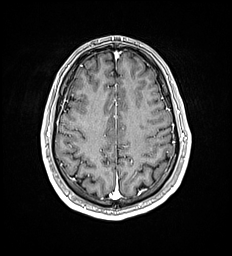
[im 146/176]
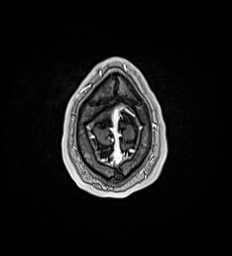
[im 176/176]
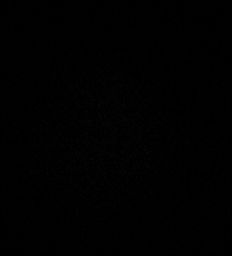

[Series 20: T1 post-contrast · coronal · 5.0mm · 0.57mm/px · 1 of 29 slices shown (2 of 2)]
[im 1/29]
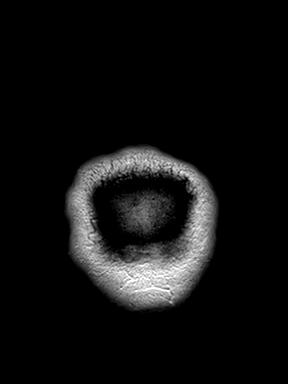

[43 of 48 positions shown; findings below may reference images not displayed]

FINDINGS: Brain: No acute infarction, hemorrhage, hydrocephalus, extra-axial
collection or mass lesion. The hippocampi are symmetric and normal
in size/signal. No abnormal enhancement. Minimal white matter
T2/FLAIR hyperintensities, likely related to chronic microvascular
ischemic disease and within normal limits for patient age.

Vascular: Major arterial flow voids are maintained at the skull
base.

Skull and upper cervical spine: Normal marrow signal.

Sinuses/Orbits: Left maxillary sinus retention cyst and mild ethmoid
air cell mucosal thickening. Remaining sinuses are clear.
Unremarkable orbits.

Other: No mastoid effusions.
IMPRESSION: No evidence of acute intracranial abnormality. No anatomic
epileptogenic focus identified.

## 2020-10-14 MED ORDER — GADOBUTROL 1 MMOL/ML IV SOLN
9.0000 mL | Freq: Once | INTRAVENOUS | Status: AC | PRN
  Administered 2020-10-14: 16:00:00 9 mL via INTRAVENOUS

## 2021-03-19 ENCOUNTER — Encounter: Payer: Self-pay | Admitting: Emergency Medicine

## 2021-03-19 ENCOUNTER — Other Ambulatory Visit: Payer: Self-pay

## 2021-03-19 ENCOUNTER — Ambulatory Visit: Admission: EM | Admit: 2021-03-19 | Discharge: 2021-03-19 | Disposition: A | Discharge status: Home / Self Care

## 2021-03-19 DIAGNOSIS — R21 Rash and other nonspecific skin eruption: Secondary | ICD-10-CM

## 2021-03-19 DIAGNOSIS — M542 Cervicalgia: Secondary | ICD-10-CM

## 2021-03-19 MED ORDER — METHYLPREDNISOLONE 4 MG PO TBPK: ORAL_TABLET | ORAL | 0 refills | Status: AC

## 2021-03-19 MED ORDER — KETOROLAC TROMETHAMINE 60 MG/2ML IM SOLN
60.0000 mg | Freq: Once | INTRAMUSCULAR | Status: AC
  Administered 2021-03-19: 60 mg via INTRAMUSCULAR

## 2021-03-19 NOTE — ED Triage Notes (Signed)
Pt c/o neck pain. Started about 3 days ago. He states the pain is at the back of his neck. No known injury.

## 2021-03-19 NOTE — Discharge Instructions (Signed)
NECK PAIN: You have a rash in the area that is painful.  I suspect you may be having pain due to a localized allergic type reaction.  There is also the possibility that this is musculoskeletal type pain and the rash is unrelated.  In any case, we will treat with corticosteroids which should help.  Also consider taking Benadryl.  Stressed avoiding painful activities. This can exacerbate your symptoms and make them worse.  May apply heat to the areas of pain for some relief. Use medications as directed. Be aware of which medications make you drowsy and do not drive or operate any kind of heavy machinery while using the medication (ie pain medications or muscle relaxers). F/U with PCP for reexamination or return sooner if condition worsens or does not begin to improve over the next few days.   NECK PAIN RED FLAGS: If symptoms get worse than they are right now, you should come back sooner for re-evaluation. If you have fever, increased numbness/ tingling or notice that the numbness/tingling is affecting the legs or saddle region, go to ER. If you ever lose continence go to ER.

## 2021-03-19 NOTE — ED Provider Notes (Signed)
[facility]    CSN: [id number] Arrival date & time: 03/19/21  1045      History   Chief Complaint Chief Complaint  Patient presents with  . Neck Pain    HPI Tyler Pearson is a 35 y.o. male presenting for posterior neck pain x3 days.  Patient states that he is here today because his wife wanted him to get checked out.  He says the pain is constant and does not sore when he touches the area but it does hurt whenever he flexes or extends his neck.  No increased pain with rotation of the neck.  No injuries.  No change in his activity.  Denies any radiation of pain to his upper extremities.  He says he does have a little bit of pressure behind the left eye which he thought might of been a migraine so he took his sumatriptan and tramadol that he has for migraines and it did not help.   Patient has also tried ibuprofen and Tylenol for the neck pain without relief.  He denies any fevers, fatigue, visual disturbances, nausea/vomiting, syncope or presyncope.  Patient denies any known insect bites or stings.  He is unaware of any rashes.  He has history of epilepsy.  No other concerns.  HPI  Past Medical History:  Diagnosis Date  . Anxiety   . Back pain   . High cholesterol   . Non-alcoholic fatty liver disease   . Vertigo     Patient Active Problem List   Diagnosis Date Noted  . Chest pain 05/11/2018  . HLD (hyperlipidemia) 05/11/2018  . Anxiety 05/11/2018  . ACUTE PHARYNGITIS 04/28/2010  . HEADACHE 04/28/2010  . NAUSEA ALONE 04/28/2010  . BACK PAIN, THORACIC REGION 02/20/2009  . LUMBAR STRAIN 02/20/2009  . CONTUSION OF BACK 02/20/2009    Past Surgical History:  Procedure Laterality Date  . APPENDECTOMY         Home Medications    Prior to Admission medications   Medication Sig Start Date End Date Taking? Authorizing Provider  atorvastatin (LIPITOR) 40 MG tablet Take 20 mg by mouth daily.   Yes [provider]  fenofibrate (TRICOR) 145 MG tablet Take  145 mg by mouth at bedtime.    Yes [provider]  lurasidone (LATUDA) 40 MG TABS tablet Take 20 mg by mouth at bedtime.    Yes [provider]  Melatonin 5 MG TABS Take 5 mg by mouth at bedtime as needed (sleep).   Yes [provider]  methylPREDNISolone (MEDROL DOSEPAK) 4 MG TBPK tablet Take according to dose pack instructions 03/19/21 03/25/21 Yes Shirlee Latch, PA-C  Omega-3 Fatty Acids (FISH OIL) 1000 MG CAPS Take 2,000 mg by mouth daily.   Yes [provider]  traMADol (ULTRAM) 50 MG tablet Take 50 mg by mouth 2 (two) times daily.   Yes [provider]  benzonatate (TESSALON) 200 MG capsule Take 1 capsule (200 mg total) by mouth 3 (three) times daily as needed for cough. 06/04/20   Tommie Sams, DO  lamoTRIgine (LAMICTAL) 100 MG tablet Take 100 mg by mouth 2 (two) times daily. 02/13/21   [provider]  naproxen (NAPROSYN) 375 MG tablet Take 375 mg by mouth 2 (two) times daily with a meal.    [provider]  pantoprazole (PROTONIX) 40 MG tablet Take 1 tablet (40 mg total) by mouth daily. 03/08/20   Tommie Sams, DO  propranolol (INDERAL) 10 MG tablet Take 10 mg  by mouth 3 (three) times daily.    [provider]  venlafaxine XR (EFFEXOR-XR) 150 MG 24 hr capsule Take 300 mg by mouth daily with breakfast.    [provider]  lithium carbonate 300 MG capsule Take 300 mg by mouth at bedtime.   03/08/20  [provider]    Family History Family History  Problem Relation Age of Onset  . Suicidality Mother   . Cirrhosis Father     Social History Social History   Tobacco Use  . Smoking status: Never Smoker  . Smokeless tobacco: Never Used  Vaping Use  . Vaping Use: Never used  Substance Use Topics  . Alcohol use: Yes    Comment: rarely  . Drug use: Never     Allergies   Patient has no known allergies.   Review of Systems Review of Systems  Constitutional: Negative for fatigue and fever.   Musculoskeletal: Positive for neck pain. Negative for arthralgias, back pain and neck stiffness.  Skin: Negative for color change, rash and wound.  Neurological: Negative for dizziness, weakness, numbness and headaches.     Physical Exam Triage Vital Signs ED Triage Vitals  Enc Vitals Group     BP 03/19/21 1139 122/81     Pulse Rate 03/19/21 1139 76     Resp 03/19/21 1139 18     Temp 03/19/21 1139 98.5 F (36.9 C)     Temp Source 03/19/21 1139 Oral     SpO2 03/19/21 1139 100 %     Weight 03/19/21 1137 188 lb 0.8 oz (85.3 kg)     Height 03/19/21 1137 5\' 9"  (1.753 m)     Head Circumference --      Peak Flow --      Pain Score 03/19/21 1137 7     Pain Loc --      Pain Edu? --      Excl. in GC? --    No data found.  Updated Vital Signs BP 122/81 (BP Location: Right Arm)   Pulse 76   Temp 98.5 F (36.9 C) (Oral)   Resp 18   Ht 5\' 9"  (1.753 m)   Wt 188 lb 0.8 oz (85.3 kg)   SpO2 100%   BMI 27.77 kg/m       Physical Exam Vitals and nursing note reviewed.  Constitutional:      General: He is not in acute distress.    Appearance: Normal appearance. He is well-developed. He is not ill-appearing.  HENT:     Head: Normocephalic and atraumatic.  Eyes:     General: No scleral icterus.    Extraocular Movements: Extraocular movements intact.     Conjunctiva/sclera: Conjunctivae normal.     Pupils: Pupils are equal, round, and reactive to light.  Neck:     Comments: Painful flexion and extension of neck. Negative meningeal signs Cardiovascular:     Rate and Rhythm: Normal rate and regular rhythm.     Heart sounds: Normal heart sounds.  Pulmonary:     Effort: Pulmonary effort is normal. No respiratory distress.     Breath sounds: Normal breath sounds.  Musculoskeletal:     Cervical back: Normal range of motion and neck supple. No tenderness.  Skin:    General: Skin is warm and dry.     Findings: Rash (erythematous patchy macular rash posterior neck where he says the  pain is. No vesicles and non tender. Crosses midline) present.  Neurological:     General:  No focal deficit present.     Mental Status: He is alert and oriented to person, place, and time. Mental status is at baseline.     Cranial Nerves: No cranial nerve deficit.     Motor: No weakness.     Gait: Gait normal.  Psychiatric:        Mood and Affect: Mood normal.        Behavior: Behavior normal.        Thought Content: Thought content normal.      UC Treatments / Results  Labs (all labs ordered are listed, but only abnormal results are displayed) Labs Reviewed - No data to display  EKG   Radiology No results found.  Procedures Procedures (including critical care time)  Medications Ordered in UC Medications  ketorolac (TORADOL) injection 60 mg (60 mg Intramuscular Given 03/19/21 1214)    Initial Impression / Assessment and Plan / UC Course  I have reviewed the triage vital signs and the nursing notes.  Pertinent labs & imaging results that were available during my care of the patient were reviewed by me and considered in my medical decision making (see chart for details).   35 year old male presenting for neck pain.  Vital signs are all normal and stable today.  He does have a rash of the posterior neck where he describes that his pain is located.  I do have some concern about possible insect bite or sting and localized reaction that could be reason for his pain.  Do not suspect shingles since it is not vesicular, painful to touch and does cross the midline.  Suspect that his left eye pressure is likely due to migraine, probably secondary to his neck pain.  Advised him to continue his at home migraine medicine for that.  He has been given 60 mg IM ketorolac in the clinic for his neck pain.  Sent him home with Medrol to start for possible localized allergic reaction versus musculoskeletal type pain and a possible unrelated rash.  Also encouraged him to try Benadryl, heat and ice.   Advised him to follow back up with us, PCP or ED for any worsening symptoms or if he is not improving.  I did review ED precautions with patient, especially if he develops a fever, neck stiffness, or worsening rash.   Final Clinical Impressions(s) / UC Diagnoses   Final diagnoses:  Neck pain  Rash and nonspecific skin eruption     Discharge Instructions     NECK PAIN: You have a rash in the area that is painful.  I suspect you may be having pain due to a localized allergic type reaction.  There is also the possibility that this is musculoskeletal type pain and the rash is unrelated.  In any case, we will treat with corticosteroids which should help.  Also consider taking Benadryl.  Stressed avoiding painful activities. This can exacerbate your symptoms and make them worse.  May apply heat to the areas of pain for some relief. Use medications as directed. Be aware of which medications make you drowsy and do not drive or operate any kind of heavy machinery while using the medication (ie pain medications or muscle relaxers). F/U with PCP for reexamination or return sooner if condition worsens or does not begin to improve over the next few days.   NECK PAIN RED FLAGS: If symptoms get worse than they are right now, you should come back sooner for re-evaluation. If you have fever, increased numbness/ tingling or notice  that the numbness/tingling is affecting the legs or saddle region, go to ER. If you ever lose continence go to ER.        ED Prescriptions    Medication Sig Dispense Auth. Provider   methylPREDNISolone (MEDROL DOSEPAK) 4 MG TBPK tablet Take according to dose pack instructions 21 tablet Shirlee Latch, PA-C     PDMP not reviewed this encounter.   Shirlee Latch, PA-C 03/19/21 1231

## 2022-03-06 ENCOUNTER — Ambulatory Visit: Admission: EM | Admit: 2022-03-06 | Discharge: 2022-03-06 | Disposition: A | Discharge status: Home / Self Care

## 2022-03-06 DIAGNOSIS — H9192 Unspecified hearing loss, left ear: Secondary | ICD-10-CM | POA: Diagnosis not present

## 2022-03-06 NOTE — ED Triage Notes (Signed)
Pt present not able to hear out of his left ear. Pt states on Tuesday is when he noticed that he cannot hear out of his left ear. Pt denies any pain or pressure.  ?

## 2022-03-06 NOTE — Discharge Instructions (Addendum)
Please go to [facility] at their [place] office.  They will see you at 1:30 PM this afternoon.  You need to bring your photo ID, insurance card, and a list of any medications that you are taking. ? ?Their address is as follows: ?[facility] ?[address], [place], [place] [address] ?

## 2022-03-06 NOTE — ED Provider Notes (Signed)
?[facility] ? ? ? ?CSN: [id number] ?Arrival date & time: 03/06/22  [id number] ? ? ?  ? ?History   ?Chief Complaint ?Chief Complaint  ?Patient presents with  ? Otalgia  ? ? ?HPI ?Tamala BariKevin Bruyere is a 36 y.o. male.  ? ?HPI ? ?36 year old male here for evaluation of hearing loss. ? ?Patient reports that 2 days ago he was talking with his wife on the phone and then abruptly had a marked decrease in his hearing in his left ear that happened around 10:30 at night.  Since then he has tried some hydroperoxide and had some earwax, nausea as well as a small scab but this did not improve his hearing.  He has tried equalizing his ears multiple times with slight improvement but no significant improvement.  He does occasionally have ringing in his ears but he has not had any at present.  He also denies pain or drainage from the ear. ? ?Past Medical History:  ?Diagnosis Date  ? Anxiety   ? Back pain   ? High cholesterol   ? Non-alcoholic fatty liver disease   ? Vertigo   ? ? ?Patient Active Problem List  ? Diagnosis Date Noted  ? Chest pain 05/11/2018  ? HLD (hyperlipidemia) 05/11/2018  ? Anxiety 05/11/2018  ? ACUTE PHARYNGITIS 04/28/2010  ? HEADACHE 04/28/2010  ? NAUSEA ALONE 04/28/2010  ? BACK PAIN, THORACIC REGION 02/20/2009  ? LUMBAR STRAIN 02/20/2009  ? CONTUSION OF BACK 02/20/2009  ? ? ?Past Surgical History:  ?Procedure Laterality Date  ? APPENDECTOMY    ? ? ? ? ? ?Home Medications   ? ?Prior to Admission medications   ?Medication Sig Start Date End Date Taking? Authorizing Provider  ?atorvastatin (LIPITOR) 40 MG tablet Take 20 mg by mouth daily.    [provider]  ?benzonatate (TESSALON) 200 MG capsule Take 1 capsule (200 mg total) by mouth 3 (three) times daily as needed for cough. 06/04/20   Tommie Samsook, Jayce G, DO  ?fenofibrate (TRICOR) 145 MG tablet Take 145 mg by mouth at bedtime.     [provider]  ?lamoTRIgine (LAMICTAL) 100 MG tablet Take 100 mg by mouth 2 (two) times daily. 02/13/21   [provider]  ?lurasidone (LATUDA) 40 MG TABS tablet Take 20 mg by mouth at bedtime.     [provider]  ?Melatonin 5 MG TABS Take 5 mg by mouth at bedtime as needed (sleep).    [provider]  ?naproxen (NAPROSYN) 375 MG tablet Take 375 mg by mouth 2 (two) times daily with a meal.    [provider]  ?Omega-3 Fatty Acids (FISH OIL) 1000 MG CAPS Take 2,000 mg by mouth daily.    [provider]  ?pantoprazole (PROTONIX) 40 MG tablet Take 1 tablet (40 mg total) by mouth daily. 03/08/20   Tommie Samsook, Jayce G, DO  ?propranolol (INDERAL) 10 MG tablet Take 10 mg by mouth 3 (three) times daily.    [provider]  ?traMADol (ULTRAM) 50 MG tablet Take 50 mg by mouth 2 (two) times daily.    [provider]  ?venlafaxine XR (EFFEXOR-XR) 150 MG 24 hr capsule Take 300 mg by mouth daily with breakfast.    [provider]  ?lithium carbonate 300 MG capsule Take 300 mg by mouth at bedtime.   03/08/20  [provider]  ? ? ?Family History ?Family History  ?Problem Relation Age of Onset  ? Suicidality Mother   ? Cirrhosis Father   ? ? ?  Social History ?Social History  ? ?Tobacco Use  ? Smoking status: Never  ? Smokeless tobacco: Never  ?Vaping Use  ? Vaping Use: Never used  ?Substance Use Topics  ? Alcohol use: Yes  ?  Comment: rarely  ? Drug use: Never  ? ? ? ?Allergies   ?Ambien [zolpidem] and Effexor xr [venlafaxine hcl] ? ? ?Review of Systems ?Review of Systems  ?Constitutional:  Negative for fever.  ?HENT:  Positive for hearing loss. Negative for congestion, ear discharge, ear pain, rhinorrhea and tinnitus.   ? ? ?Physical Exam ?Triage Vital Signs ?ED Triage Vitals  ?Enc Vitals Group  ?   BP 03/06/22 0836 132/87  ?   Pulse Rate 03/06/22 0836 84  ?   Resp 03/06/22 0836 18  ?   Temp 03/06/22 0836 98.3 ?F (36.8 ?C)  ?   Temp Source 03/06/22 0836 Oral  ?   SpO2 03/06/22 0836 100 %  ?   Weight --   ?   Height --   ?   Head Circumference --   ?   Peak Flow --   ?    Pain Score 03/06/22 0837 0  ?   Pain Loc --   ?   Pain Edu? --   ?   Excl. in GC? --   ? ?No data found. ? ?Updated Vital Signs ?BP 132/87 (BP Location: Left Arm)   Pulse 84   Temp 98.3 ?F (36.8 ?C) (Oral)   Resp 18   SpO2 100%  ? ?Visual Acuity ?Right Eye Distance:   ?Left Eye Distance:   ?Bilateral Distance:   ? ?Right Eye Near:   ?Left Eye Near:    ?Bilateral Near:    ? ?Physical Exam ?Vitals and nursing note reviewed.  ?Constitutional:   ?   Appearance: Normal appearance. He is not ill-appearing.  ?HENT:  ?   Head: Normocephalic and atraumatic.  ?   Right Ear: Tympanic membrane, ear canal and external ear normal. There is no impacted cerumen.  ?   Left Ear: Tympanic membrane and external ear normal. There is no impacted cerumen.  ?   Ears:  ?   Comments: There is a small scab on the floor of the left external auditory canal.  The starting tissue does not demonstrate erythema or edema.  Both tympanic membranes are pearly gray in appearance with normal light reflex. ?Skin: ?   General: Skin is warm and dry.  ?   Capillary Refill: Capillary refill takes less than 2 seconds.  ?   Findings: No erythema or rash.  ?Neurological:  ?   General: No focal deficit present.  ?   Mental Status: He is alert and oriented to person, place, and time.  ?Psychiatric:     ?   Mood and Affect: Mood normal.     ?   Behavior: Behavior normal.     ?   Thought Content: Thought content normal.     ?   Judgment: Judgment normal.  ? ? ? ?UC Treatments / Results  ?Labs ?(all labs ordered are listed, but only abnormal results are displayed) ?Labs Reviewed - No data to display ? ?EKG ? ? ?Radiology ?No results found. ? ?Procedures ?Procedures (including critical care time) ? ?Medications Ordered in UC ?Medications - No data to display ? ?Initial Impression / Assessment and Plan / UC Course  ?I have reviewed the triage vital signs and the nursing notes. ? ?Pertinent labs & imaging results that were  available during my care of the patient  were reviewed by me and considered in my medical decision making (see chart for details). ? ?Nontoxic-appearing 36 year old male here for evaluation of acute onset of hearing loss in his left ear that began 2 days ago and around 10:30 at night while he was talking on the phone with his wife.  He states that the ear is not painful and there is no drainage.  No ringing in his ear.  He reports that he is equalizes ears multiple times and will have a slight improvement but it goes right back to marked decrease in hearing in the ear.  On exam both patient's tympanic membranes are clearly visible and they are pearly gray in appearance with normal light reflex.  Both external auditory canals are clear.  No cerumen noted.  There is a small scab on the floor of the left external auditory canal but there is no erythema or edema of the surrounding tissue.  Patient does not have any tenderness with palpation of the eustachian tubes bilaterally.  He is able to equalize his ears without difficulty but does not any improvement in his hearing.  I believe the patient needs a hearing test and more in-depth audiological evaluation.  I will call [facility] to see if they can see him today. ? ?I have spoken with the [place] office as well as the [place] office.  They are able to see him at 130 this afternoon. ? ? ?Final Clinical Impressions(s) / UC Diagnoses  ? ?Final diagnoses:  ?Hearing loss of left ear, unspecified hearing loss type  ? ? ? ?Discharge Instructions   ? ?  ?Please go to [facility] at their [place] office.  They will see you at 1:30 PM this afternoon.  You need to bring your photo ID, insurance card, and a list of any medications that you are taking. ? ?Their address is as follows: ?[facility] ?[address], [place], [place] [address] ? ? ? ? ?ED Prescriptions   ?None ?  ? ?PDMP not reviewed this encounter. ?  ?Becky Augusta, NP ?03/06/22 0946 ? ?

## 2022-03-17 ENCOUNTER — Other Ambulatory Visit: Payer: Self-pay | Admitting: Otolaryngology

## 2022-03-17 DIAGNOSIS — H918X9 Other specified hearing loss, unspecified ear: Secondary | ICD-10-CM

## 2022-03-17 DIAGNOSIS — R42 Dizziness and giddiness: Secondary | ICD-10-CM

## 2022-03-27 ENCOUNTER — Ambulatory Visit
Admission: RE | Admit: 2022-03-27 | Discharge: 2022-03-27 | Disposition: A | Discharge status: Home / Self Care | Source: Ambulatory Visit | Attending: Otolaryngology | Admitting: Otolaryngology

## 2022-03-27 DIAGNOSIS — H918X9 Other specified hearing loss, unspecified ear: Secondary | ICD-10-CM

## 2022-03-27 DIAGNOSIS — R42 Dizziness and giddiness: Secondary | ICD-10-CM

## 2022-03-27 IMAGING — MR MR BRAIN/TEMPORAL BONE/IAC
12 of 13 series · 45 of 48 positions shown · IV contrast (17 mL Multihance)
Comparison: Brain MRI [DATE].

CLINICAL DATA: Asymmetrical hearing loss. Dizzy. Additional history
provided: Patient reports dizziness, sudden hearing loss in left
ear.

EXAM:
MRI HEAD WITHOUT AND WITH CONTRAST
TECHNIQUE: Multiplanar, multiecho pulse sequences of the brain and surrounding
structures were obtained without and with intravenous contrast.
CONTRAST:  17mL MULTIHANCE GADOBENATE DIMEGLUMINE 529 MG/ML IV SOLN

[Series 5: T1 · sagittal · 4.0mm · 0.72mm/px · 1 of 29 slices shown (1 of 3)]
[im 1/29]
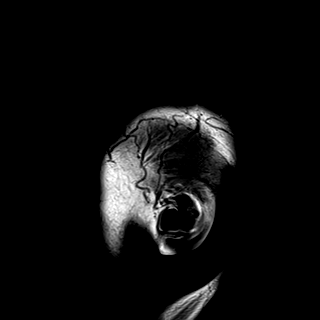

[Series 6: DWI · axial · 3.0mm · 0.94mm/px · z∈[-76,+75]mm · 12 of 172 slices shown]
[im 1/172]
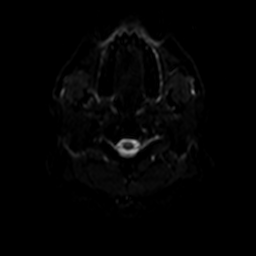
[im 16/172]
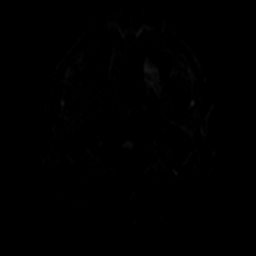
[im 32/172]
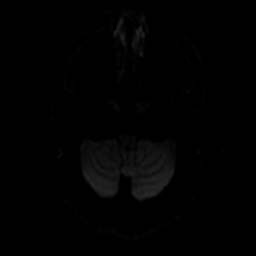
[im 47/172]
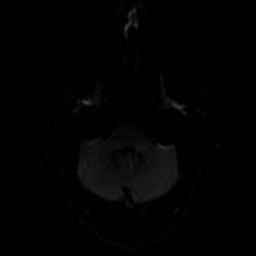
[im 63/172]
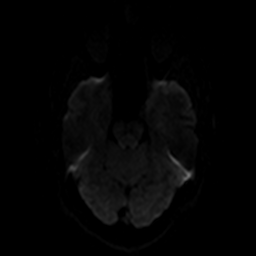
[im 78/172]
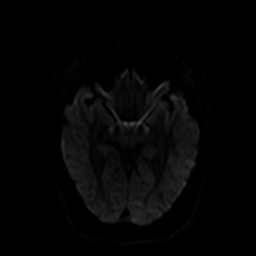
[im 94/172]
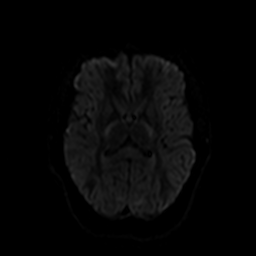
[im 109/172]
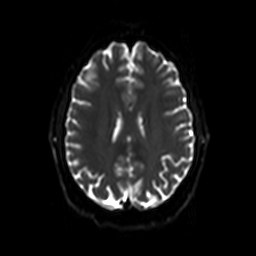
[im 125/172]
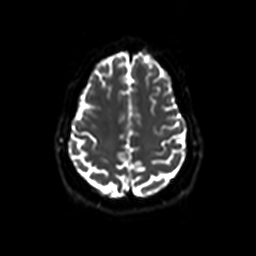
[im 140/172]
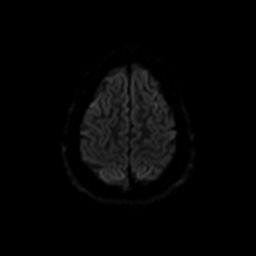
[im 156/172]
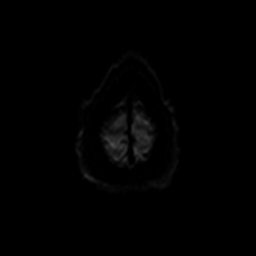
[im 172/172]
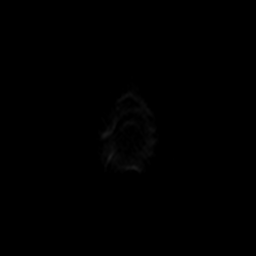

[Series 7: ax dwi_tracew · axial · 3.0mm · 0.94mm/px · z∈[-76,+75]mm · 6 of 86 slices shown]
[im 1/86]
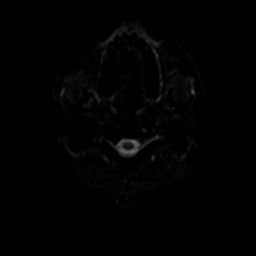
[im 18/86]
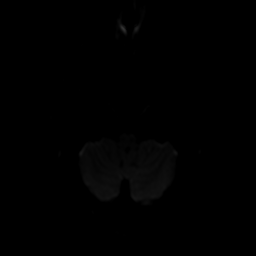
[im 35/86]
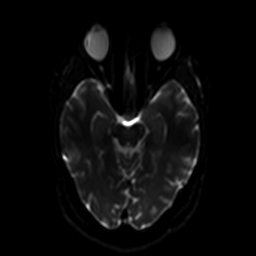
[im 52/86]
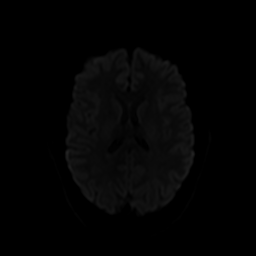
[im 69/86]
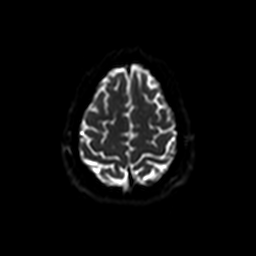
[im 86/86]
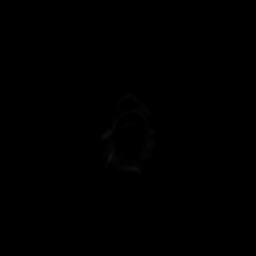

[Series 8: ax dwi_adc · axial · 3.0mm · 0.94mm/px · z∈[-76,+75]mm · 3 of 43 slices shown]
[im 1/43]
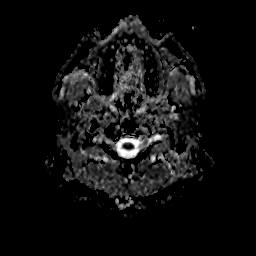
[im 22/43]
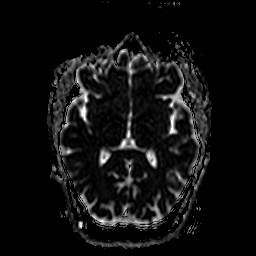
[im 43/43]
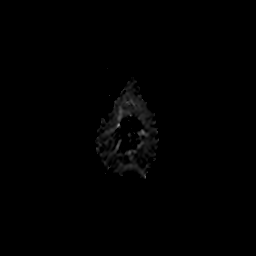

[Series 9: T2 · axial · 4.0mm · 0.36mm/px · z∈[-83,+73]mm · 2 of 31 slices shown]
[im 1/31]
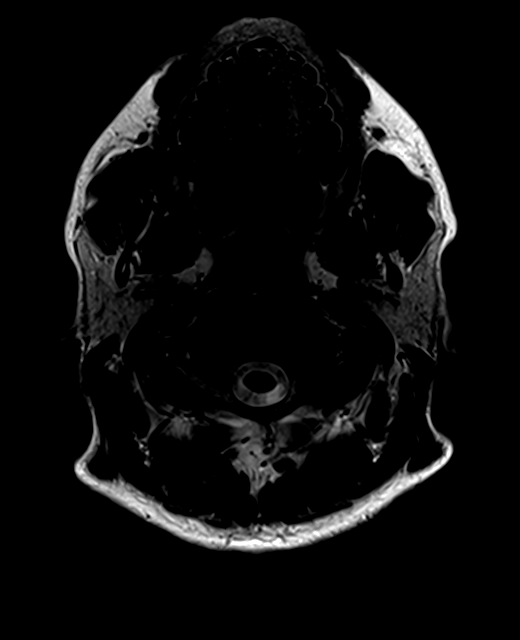
[im 31/31]
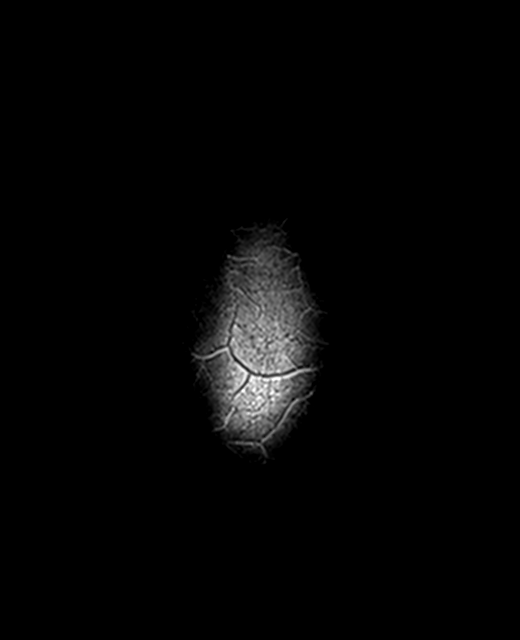

[Series 10: FLAIR · axial · 3.0mm · 0.72mm/px · z∈[-86,+76]mm · 2 of 28 slices shown]
[im 1/28]
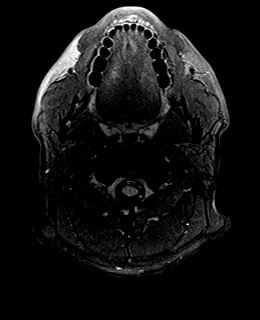
[im 28/28]
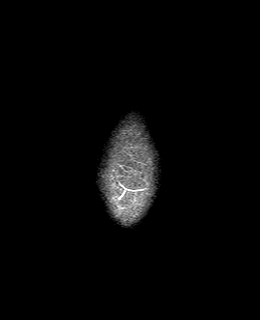

[Series 12: swi_images · axial · 3.0mm · 0.90mm/px · z∈[-80,+73]mm · 4 of 52 slices shown]
[im 1/52]
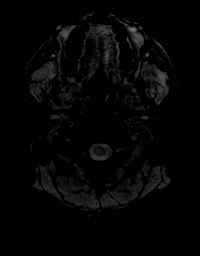
[im 18/52]
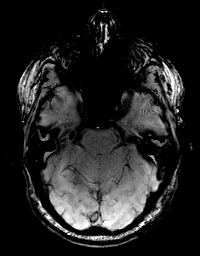
[im 35/52]
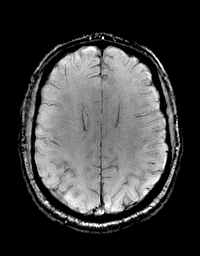
[im 52/52]
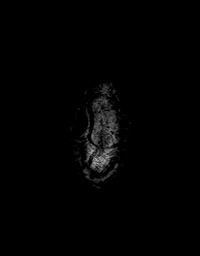

[Series 14: T1 · coronal · 3.0mm · 0.56mm/px · 1 of 13 slices shown (2 of 3)]
[im 1/13]
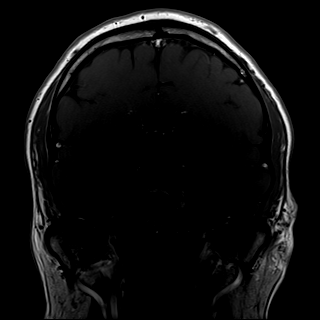

[Series 16: T1 · axial · 3.0mm · 0.50mm/px · 1 of 13 slices shown (3 of 3)]
[im 1/13]
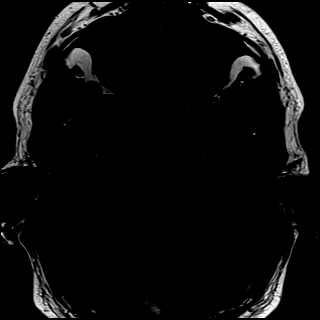

[Series 17: T1 post-contrast · coronal · 3.0mm · 0.56mm/px · 1 of 13 slices shown (1 of 3)]
[im 1/13]
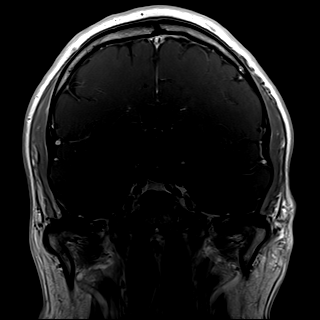

[Series 18: T1 post-contrast · axial · 3.0mm · 0.50mm/px · 1 of 13 slices shown (2 of 3)]
[im 1/13]
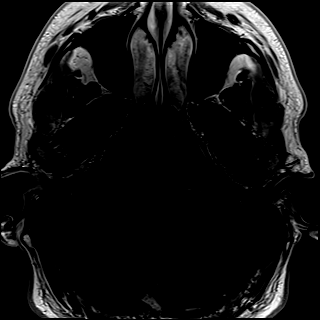

[Series 19: T1 post-contrast · axial · 1.0mm · 0.90mm/px · z∈[-81,+78]mm · 11 of 160 slices shown (3 of 3)]
[im 1/160]
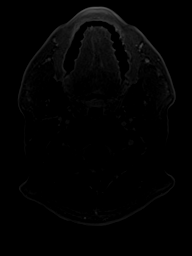
[im 16/160]
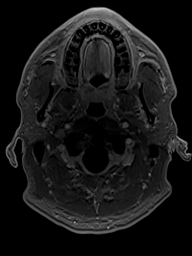
[im 32/160]
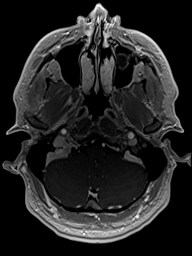
[im 48/160]
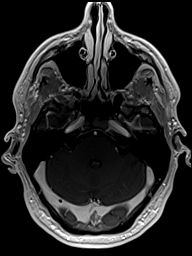
[im 64/160]
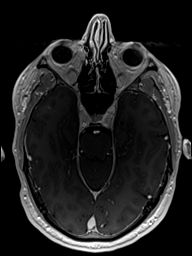
[im 80/160]
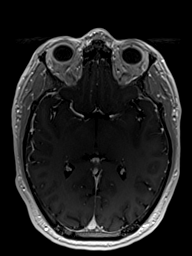
[im 96/160]
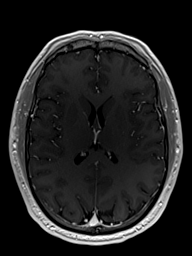
[im 112/160]
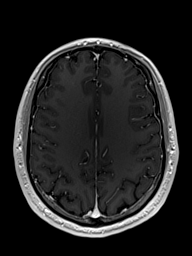
[im 128/160]
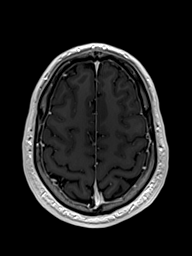
[im 144/160]
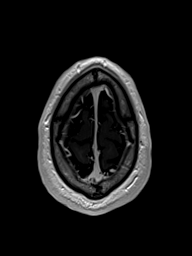
[im 160/160]
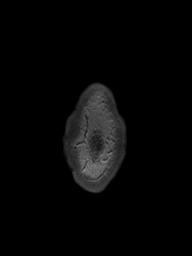

[45 of 48 positions shown; findings below may reference images not displayed]

FINDINGS: Brain:

Cerebral volume is normal.

No cortical encephalomalacia is identified. No significant cerebral
white matter disease.

No evidence of an intracranial mass. Specifically, no
cerebellopontine angle or internal auditory canal mass is
demonstrated. Unremarkable appearance of the 7th and 8th cranial
nerves bilaterally.

There is no acute infarct.

No evidence of an intracranial mass.

No chronic intracranial blood products.

No extra-axial fluid collection.

No midline shift.

No pathologic intracranial enhancement identified.

Vascular: Maintained flow voids within the proximal large arterial
vessels.

Skull and upper cervical spine: No focal suspicious marrow lesion.

Sinuses/Orbits: No mass or acute finding within the imaged orbits.
2.6 cm mucous retention cyst within the left maxillary sinus. Small
mucous retention cyst within the right maxillary sinus.
IMPRESSION: Unremarkable MRI appearance of the brain. No evidence of acute
intracranial abnormality.

No cerebellopontine angle or internal auditory canal mass.

Mucous retention cysts within the bilateral maxillary sinuses, as
described.

## 2022-03-27 MED ORDER — GADOBENATE DIMEGLUMINE 529 MG/ML IV SOLN
17.0000 mL | Freq: Once | INTRAVENOUS | Status: AC | PRN
  Administered 2022-03-27: 17 mL via INTRAVENOUS

## 2022-06-15 ENCOUNTER — Ambulatory Visit
Admission: EM | Admit: 2022-06-15 | Discharge: 2022-06-15 | Disposition: A | Discharge status: Home / Self Care | Attending: Internal Medicine | Admitting: Internal Medicine

## 2022-06-15 DIAGNOSIS — R3911 Hesitancy of micturition: Secondary | ICD-10-CM | POA: Diagnosis present

## 2022-06-15 DIAGNOSIS — M545 Low back pain, unspecified: Secondary | ICD-10-CM | POA: Diagnosis present

## 2022-06-15 DIAGNOSIS — R300 Dysuria: Secondary | ICD-10-CM | POA: Diagnosis present

## 2022-06-15 DIAGNOSIS — R630 Anorexia: Secondary | ICD-10-CM | POA: Insufficient documentation

## 2022-06-15 LAB — URINALYSIS, MICROSCOPIC (REFLEX)

## 2022-06-15 LAB — URINALYSIS, ROUTINE W REFLEX MICROSCOPIC
Glucose, UA: NEGATIVE mg/dL
Hgb urine dipstick: NEGATIVE
Leukocytes,Ua: NEGATIVE
Nitrite: NEGATIVE
Specific Gravity, Urine: >1.03 — ABNORMAL HIGH (ref 1.005–1.030)
pH: 5.5 (ref 5.0–8.0)

## 2022-06-15 MED ORDER — CIPROFLOXACIN HCL 500 MG PO TABS: 500.0000 mg | ORAL_TABLET | Freq: Two times a day (BID) | ORAL | 0 refills | Status: DC

## 2022-06-15 NOTE — ED Provider Notes (Addendum)
[facility]    CSN: [id number] Arrival date & time: 06/15/22  0827      History   Chief Complaint Chief Complaint  Patient presents with   Urinary Retention   Penile Discharge    HPI Tyler Pearson is a 36 y.o. male who presents with dysuria, white discharge x 3 weeks. Had a fever of 101.2 yesterday. Has also had decreased appetite x 3 weeks and has lost 15 lbs from not being hungry. He was in [place] and was very hot and thinks could have been the heat. Yesterday was able to eat better. Denies cloudy urine, blood in urine or constipation. Admits of urinary hesitation and decreased stream. Also admits he had more alcohol during his vacation and had been 2 years since he drank anything. Denies having sex with anyone but his wife. L back pain started one week ago, and R back pain x 3 days.     Past Medical History:  Diagnosis Date   Anxiety    Back pain    High cholesterol    Non-alcoholic fatty liver disease    Vertigo     Patient Active Problem List   Diagnosis Date Noted   Chest pain 05/11/2018   HLD (hyperlipidemia) 05/11/2018   Anxiety 05/11/2018   ACUTE PHARYNGITIS 04/28/2010   HEADACHE 04/28/2010   NAUSEA ALONE 04/28/2010   BACK PAIN, THORACIC REGION 02/20/2009   LUMBAR STRAIN 02/20/2009   CONTUSION OF BACK 02/20/2009    Past Surgical History:  Procedure Laterality Date   APPENDECTOMY         Home Medications    Prior to Admission medications   Medication Sig Start Date End Date Taking? Authorizing Provider  atorvastatin (LIPITOR) 40 MG tablet Take 20 mg by mouth daily.   Yes [provider]  ciprofloxacin (CIPRO) 500 MG tablet Take 1 tablet (500 mg total) by mouth 2 (two) times daily. 06/15/22  Yes Rodriguez-Southworth, Nettie Elm, PA-C  fenofibrate (TRICOR) 145 MG tablet Take 145 mg by mouth at bedtime.    Yes [provider]  lamoTRIgine (LAMICTAL) 100 MG tablet Take 100 mg by mouth 2 (two) times daily. 02/13/21  Yes [provider]  lurasidone (LATUDA) 40 MG TABS tablet Take 20 mg by mouth at bedtime.    Yes [provider]  Melatonin 5 MG TABS Take 5 mg by mouth at bedtime as needed (sleep).   Yes [provider]  Omega-3 Fatty Acids (FISH OIL) 1000 MG CAPS Take 2,000 mg by mouth daily.   Yes [provider]  pantoprazole (PROTONIX) 40 MG tablet Take 1 tablet (40 mg total) by mouth daily. 03/08/20  Yes Cook, Jayce G, DO  propranolol (INDERAL) 10 MG tablet Take 10 mg by mouth 3 (three) times daily.   Yes [provider]  venlafaxine XR (EFFEXOR-XR) 150 MG 24 hr capsule Take 300 mg by mouth daily with breakfast.   Yes [provider]  lithium carbonate 300 MG capsule Take 300 mg by mouth at bedtime.   03/08/20  [provider]    Family History Family History  Problem Relation Age of Onset   Suicidality Mother    Cirrhosis Father     Social History Social History   Tobacco Use   Smoking status: Never   Smokeless tobacco: Never  Vaping Use   Vaping Use: Never used  Substance Use Topics   Alcohol use: Yes    Comment: rarely   Drug use: Never  Allergies   Ambien [zolpidem] and Effexor xr [venlafaxine hcl]   Review of Systems Review of Systems  Constitutional:  Positive for appetite change and fever. Negative for activity change, chills and diaphoresis.  HENT:  Negative for congestion.   Respiratory:  Negative for cough.   Gastrointestinal:  Negative for abdominal pain, constipation, nausea and vomiting.  Genitourinary:  Positive for difficulty urinating, dysuria, flank pain, penile discharge and urgency. Negative for hematuria and penile pain.  Musculoskeletal:  Positive for back pain. Negative for myalgias.  Neurological:  Negative for headaches.     Physical Exam Triage Vital Signs ED Triage Vitals  Enc Vitals Group     BP 06/15/22 0842 133/79     Pulse Rate 06/15/22 0842 71     Resp 06/15/22 0842 12     Temp 06/15/22 0842  98.3 F (36.8 C)     Temp Source 06/15/22 0842 Oral     SpO2 06/15/22 0842 100 %     Weight 06/15/22 0836 185 lb (83.9 kg)     Height 06/15/22 0841 5\' 9"  (1.753 m)     Head Circumference --      Peak Flow --      Pain Score 06/15/22 0839 6     Pain Loc --      Pain Edu? --      Excl. in GC? --    No data found.  Updated Vital Signs BP 133/79 (BP Location: Left Arm)   Pulse 71   Temp 98.3 F (36.8 C) (Oral)   Resp 12   Ht 5\' 9"  (1.753 m)   Wt 185 lb (83.9 kg)   SpO2 100%   BMI 27.32 kg/m   Visual Acuity Right Eye Distance:   Left Eye Distance:   Bilateral Distance:    Right Eye Near:   Left Eye Near:    Bilateral Near:      Physical Exam Vitals and nursing note reviewed.  Constitutional:      General: he is not in acute distress.    Appearance: She is not toxic-appearing.  HENT:     Head: Normocephalic.     Right Ear: External ear normal.     Left Ear: External ear normal.  Eyes:     General: No scleral icterus.    Conjunctiva/sclera: Conjunctivae normal.  Pulmonary:     Effort: Pulmonary effort is normal.  Abdominal:     General: Bowel sounds are normal.     Palpations: Abdomen is soft. There is no mass. Has mild tenderness on suprapubic region. His bladder does not seem distended     Tenderness: There is no guarding or rebound.     Comments: - CVA tenderness   Musculoskeletal:        General: Normal range of motion.     Cervical back: Neck supple.     Comments: BACK- has mild R muscular tenderness on area of complaint with palpation which is mid lumbar area.  Skin:    General: Skin is warm and dry.     Findings: No rash.  Neurological:     Mental Status: She is alert and oriented to person, place, and time.     Gait: Gait normal.  Psychiatric:        Mood and Affect: Mood normal.        Behavior: Behavior normal.        Thought Content: Thought content normal.        Judgment: Judgment normal.  UC Treatments / Results  Labs (all labs  ordered are listed, but only abnormal results are displayed) Labs Reviewed  URINALYSIS, ROUTINE W REFLEX MICROSCOPIC - Abnormal; Notable for the following components:      Result Value   Specific Gravity, Urine >1.030 (*)    Bilirubin Urine SMALL (*)    Ketones, ur TRACE (*)    Protein, ur TRACE (*)    All other components within normal limits  URINALYSIS, MICROSCOPIC (REFLEX) - Abnormal; Notable for the following components:   Bacteria, UA FEW (*)    All other components within normal limits  URINE CULTURE  CYTOLOGY, (ORAL, ANAL, URETHRAL) ANCILLARY ONLY    EKG   Radiology No results found.  Procedures Procedures (including critical care time)  Medications Ordered in UC Medications - No data to display  Initial Impression / Assessment and Plan / UC Course  I have reviewed the triage vital signs and the nursing notes.  Pertinent labs  results that were available during my care of the patient were reviewed by me and considered in my medical decision making (see chart for details).  Possible prostatitis  I placed him on Cipro as noted I sent his urine for a culture We will inform him of STD test and urine culture if positive.  Needs to FU with PCP if decreased appetite does not improve back to his normal.  Final Clinical Impressions(s) / UC Diagnoses   Final diagnoses:  Dysuria  Hesitancy  Appetite loss     Discharge Instructions      Follow up with your PCP if the loss of appetite does not improve     ED Prescriptions     Medication Sig Dispense Auth. Provider   ciprofloxacin (CIPRO) 500 MG tablet Take 1 tablet (500 mg total) by mouth 2 (two) times daily. 30 tablet Rodriguez-Southworth, Sunday Spillers, PA-C      PDMP not reviewed this encounter.   Shelby Mattocks, PA-C 06/15/22 [place], Sunday Spillers, PA-C 06/15/22 [phone]

## 2022-06-15 NOTE — Discharge Instructions (Signed)
Follow up with your PCP if the loss of appetite does not improve

## 2022-06-15 NOTE — ED Triage Notes (Signed)
Pt c/o trouble urinating, lower back pain, white discharge, and painful urination x3weeks  Pt states he had a temperature of 101.2 and loss of appetite.   Pt states that he has lost 15lbs since the pain states  Pt states that he is having normal bowel movements with no blood or discoloration.   Pt states that it feels like slow flaming barbwire down the penis as he is trying to urinate.

## 2022-06-16 LAB — URINE CULTURE: Culture: NO GROWTH

## 2022-06-16 LAB — CYTOLOGY, (ORAL, ANAL, URETHRAL) ANCILLARY ONLY
Chlamydia: NEGATIVE
Comment: NEGATIVE
Comment: NEGATIVE
Comment: NORMAL
Neisseria Gonorrhea: NEGATIVE
Trichomonas: NEGATIVE

## 2024-03-25 ENCOUNTER — Observation Stay
Admission: EM | Admit: 2024-03-25 | Discharge: 2024-03-26 | Disposition: A | Discharge status: Home / Self Care | Attending: Emergency Medicine | Admitting: Emergency Medicine

## 2024-03-25 ENCOUNTER — Other Ambulatory Visit: Payer: Self-pay

## 2024-03-25 DIAGNOSIS — F4310 Post-traumatic stress disorder, unspecified: Secondary | ICD-10-CM | POA: Insufficient documentation

## 2024-03-25 DIAGNOSIS — R55 Syncope and collapse: Secondary | ICD-10-CM | POA: Diagnosis present

## 2024-03-25 DIAGNOSIS — F339 Major depressive disorder, recurrent, unspecified: Secondary | ICD-10-CM | POA: Diagnosis not present

## 2024-03-25 DIAGNOSIS — F329 Major depressive disorder, single episode, unspecified: Secondary | ICD-10-CM | POA: Insufficient documentation

## 2024-03-25 DIAGNOSIS — K219 Gastro-esophageal reflux disease without esophagitis: Secondary | ICD-10-CM | POA: Diagnosis not present

## 2024-03-25 DIAGNOSIS — I951 Orthostatic hypotension: Principal | ICD-10-CM

## 2024-03-25 DIAGNOSIS — E785 Hyperlipidemia, unspecified: Secondary | ICD-10-CM | POA: Diagnosis not present

## 2024-03-25 LAB — COMPREHENSIVE METABOLIC PANEL WITH GFR
ALT: 41 U/L (ref 0–44)
AST: 29 U/L (ref 15–41)
Albumin: 4.7 g/dL (ref 3.5–5.0)
Alkaline Phosphatase: 47 U/L (ref 38–126)
Anion gap: 14 (ref 5–15)
BUN: 14 mg/dL (ref 6–20)
CO2: 19 mmol/L — ABNORMAL LOW (ref 22–32)
Calcium: 9.2 mg/dL (ref 8.9–10.3)
Chloride: 104 mmol/L (ref 98–111)
Creatinine, Ser: 1.42 mg/dL — ABNORMAL HIGH (ref 0.61–1.24)
GFR, Estimated: >60 mL/min (ref >60)
Glucose, Bld: 167 mg/dL — ABNORMAL HIGH (ref 70–99)
Potassium: 3.2 mmol/L — ABNORMAL LOW (ref 3.5–5.1)
Sodium: 137 mmol/L (ref 135–145)
Total Bilirubin: 2.1 mg/dL — ABNORMAL HIGH (ref 0.0–1.2)
Total Protein: 7.6 g/dL (ref 6.5–8.1)

## 2024-03-25 LAB — CBC
HCT: 38.2 % — ABNORMAL LOW (ref 39.0–52.0)
Hemoglobin: 13.7 g/dL (ref 13.0–17.0)
MCH: 31.5 pg (ref 26.0–34.0)
MCHC: 35.9 g/dL (ref 30.0–36.0)
MCV: 87.8 fL (ref 80.0–100.0)
Platelets: 175 10*3/uL (ref 150–400)
RBC: 4.35 MIL/uL (ref 4.22–5.81)
RDW: 12.1 % (ref 11.5–15.5)
WBC: 7.4 10*3/uL (ref 4.0–10.5)
nRBC: 0 % (ref 0.0–0.2)

## 2024-03-25 LAB — TROPONIN I (HIGH SENSITIVITY): Troponin I (High Sensitivity): 7 ng/L (ref <18)

## 2024-03-25 MED ORDER — LURASIDONE HCL 40 MG PO TABS: 20.0000 mg | ORAL_TABLET | Freq: Every day | ORAL | Status: DC

## 2024-03-25 MED ORDER — PROPRANOLOL HCL 20 MG PO TABS: 10.0000 mg | ORAL_TABLET | Freq: Three times a day (TID) | ORAL | Status: DC

## 2024-03-25 MED ORDER — ENOXAPARIN SODIUM 40 MG/0.4ML IJ SOSY
40.0000 mg | PREFILLED_SYRINGE | INTRAMUSCULAR | Status: DC
  Administered 2024-03-26: 40 mg via SUBCUTANEOUS
  Filled 2024-03-25: qty 0.4

## 2024-03-25 MED ORDER — FENOFIBRATE 160 MG PO TABS: 160.0000 mg | ORAL_TABLET | Freq: Every day | ORAL | Status: DC

## 2024-03-25 MED ORDER — OMEGA-3-ACID ETHYL ESTERS 1 G PO CAPS: 2.0000 g | ORAL_CAPSULE | Freq: Every day | ORAL | Status: DC

## 2024-03-25 MED ORDER — SODIUM CHLORIDE 0.9% FLUSH
3.0000 mL | Freq: Two times a day (BID) | INTRAVENOUS | Status: DC
  Administered 2024-03-26 (×2): 3 mL via INTRAVENOUS

## 2024-03-25 MED ORDER — ACETAMINOPHEN 650 MG RE SUPP: 650.0000 mg | Freq: Four times a day (QID) | RECTAL | Status: DC | PRN

## 2024-03-25 MED ORDER — MAGNESIUM HYDROXIDE 400 MG/5ML PO SUSP: 30.0000 mL | Freq: Every day | ORAL | Status: DC | PRN

## 2024-03-25 MED ORDER — MELATONIN 5 MG PO TABS: 5.0000 mg | ORAL_TABLET | Freq: Every evening | ORAL | Status: DC | PRN

## 2024-03-25 MED ORDER — VENLAFAXINE HCL ER 150 MG PO CP24: 300.0000 mg | ORAL_CAPSULE | Freq: Every day | ORAL | Status: DC

## 2024-03-25 MED ORDER — LAMOTRIGINE 100 MG PO TABS: 100.0000 mg | ORAL_TABLET | Freq: Two times a day (BID) | ORAL | Status: DC

## 2024-03-25 MED ORDER — POTASSIUM CHLORIDE IN NACL 20-0.9 MEQ/L-% IV SOLN
INTRAVENOUS | Status: DC
  Filled 2024-03-25 (×2): qty 1000

## 2024-03-25 MED ORDER — ACETAMINOPHEN 325 MG PO TABS
650.0000 mg | ORAL_TABLET | Freq: Four times a day (QID) | ORAL | Status: DC | PRN
Start: 2024-03-25 — End: 2024-03-26

## 2024-03-25 MED ORDER — ONDANSETRON HCL 4 MG PO TABS: 4.0000 mg | ORAL_TABLET | Freq: Four times a day (QID) | ORAL | Status: DC | PRN

## 2024-03-25 MED ORDER — SODIUM CHLORIDE 0.9 % IV SOLN: Freq: Once | INTRAVENOUS | Status: AC

## 2024-03-25 MED ORDER — ATORVASTATIN CALCIUM 20 MG PO TABS: 20.0000 mg | ORAL_TABLET | Freq: Every day | ORAL | Status: DC

## 2024-03-25 MED ORDER — ONDANSETRON HCL 4 MG/2ML IJ SOLN: 4.0000 mg | Freq: Four times a day (QID) | INTRAMUSCULAR | Status: DC | PRN

## 2024-03-25 MED ORDER — PANTOPRAZOLE SODIUM 40 MG PO TBEC: 40.0000 mg | DELAYED_RELEASE_TABLET | Freq: Every day | ORAL | Status: DC

## 2024-03-25 MED ORDER — TRAZODONE HCL 50 MG PO TABS: 25.0000 mg | ORAL_TABLET | Freq: Every evening | ORAL | Status: DC | PRN

## 2024-03-25 NOTE — ED Notes (Signed)
 Lab called at this time for labs. Blood work sent. Lab waiting on orders.

## 2024-03-25 NOTE — ED Provider Notes (Signed)
 [facility] Provider Note    Event Date/Time   First MD Initiated Contact with Patient 03/25/24 1742     (approximate)   History   Loss of Consciousness   HPI  Tyler Pearson is a 38 y.o. male with a history of anxiety, PTSD, hyperlipidemia presents with complaints of dizziness, loss of consciousness.  Patient reports has been feeling very lightheaded since this morning.  Multiple near syncopal episodes.  Denies chest pain, denies abdominal pain, normal stools.  No palpitations reported.  No new medications reported     Physical Exam   Triage Vital Signs: ED Triage Vitals [03/25/24 1726]  Encounter Vitals Group     BP 134/78     Systolic BP Percentile      Diastolic BP Percentile      Pulse Rate 92     Resp 16     Temp 98.2 F (36.8 C)     Temp Source Oral     SpO2 98 %     Weight      Height      Head Circumference      Peak Flow      Pain Score 0     Pain Loc      Pain Education      Exclude from Growth Chart     Most recent vital signs: Vitals:   03/25/24 1900 03/25/24 2130  BP: 112/67 108/66  Pulse: 86 92  Resp: 12 20  Temp:    SpO2: 100% 100%     General: Awake, no distress.  CV:  Good peripheral perfusion.  Positive systolic murmur Resp:  Normal effort.  Clear to auscultation bilaterally Abd:  No distention.  Soft, nontender Other:  No calf pain or swelling   ED Results / Procedures / Treatments   Labs (all labs ordered are listed, but only abnormal results are displayed) Labs Reviewed  CBC - Abnormal; Notable for the following components:      Result Value   HCT 38.2 (*)    All other components within normal limits  COMPREHENSIVE METABOLIC PANEL WITH GFR - Abnormal; Notable for the following components:   Potassium 3.2 (*)    CO2 19 (*)    Glucose, Bld 167 (*)    Creatinine, Ser 1.42 (*)    Total Bilirubin 2.1 (*)    All other components within normal limits  TROPONIN I (HIGH SENSITIVITY)     EKG  ED ECG  REPORT I, Bryson Carbine, the attending physician, personally viewed and interpreted this ECG.  Date: 03/25/2024  Rhythm: normal sinus rhythm QRS Axis: normal Intervals: normal ST/T Wave abnormalities: normal Narrative Interpretation: no evidence of acute ischemia    RADIOLOGY     PROCEDURES:  Critical Care performed:   Procedures   MEDICATIONS ORDERED IN ED: Medications  0.9 %  sodium chloride  infusion (0 mLs Intravenous Stopped 03/25/24 2104)     IMPRESSION / MDM / ASSESSMENT AND PLAN / ED COURSE  I reviewed the triage vital signs and the nursing notes. Patient's presentation is most consistent with acute presentation with potential threat to life or bodily function.  Patient presents with near syncopal episodes as detailed above, hypotensive for EMS, they did give IV fluids.  EKG here is reassuring, pending labs  Patient had episode of lightheadedness where his blood pressure did drop, will give additional fluids at this time, still pending labs.  After 2 L of normal saline, patient is still significantly orthostatic, I  have consulted the hospitalist for admission      FINAL CLINICAL IMPRESSION(S) / ED DIAGNOSES   Final diagnoses:  Orthostatic hypotension     Rx / DC Orders   ED Discharge Orders     None        Note:  This document was prepared using Dragon voice recognition software and may include unintentional dictation errors.   Bryson Carbine, MD 03/25/24 2202

## 2024-03-25 NOTE — H&P (Incomplete)
 [place]   PATIENT NAME: Tyler Pearson    MR#:  [id number]  DATE OF BIRTH:  18-Aug-1986  DATE OF ADMISSION:  03/25/2024  PRIMARY CARE PHYSICIAN: [facility]   Patient is coming from: Home  REQUESTING/REFERRING PHYSICIAN: Bryson Carbine, MD  CHIEF COMPLAINT:   Chief Complaint  Patient presents with  . Loss of Consciousness    HISTORY OF PRESENT ILLNESS:  Tyler Pearson is a 38 y.o. Caucasian male with medical history significant for anxiety, dyslipidemia and NASH, who presented to the emergency room with acute onset of recurrent syncope and near syncope.  Patient has been taking prazosin for PTSD with nightmares and does not increased the dose to 10 mg.  Today he was anxious and took Klonopin.  He stated that became flushed and diaphoretic.  He did lose consciousness briefly.  He stated that he did not have much appetite today but has been drinking water.  He has been having diminished urine output unlike his usual though.  He denies any fever or chills.  No dysuria, urinary frequency or urgency.  No hematuria or flank pain.  No cough or wheezing or dyspnea.  He admitted to mild chest tightness and palpitation when he is standing up from laying down or sitting position.  ED Course: When he came to the ER, BP was initially 134/78 and later 99/53 with heart rate of 92 and later 116 and respiratory rate of 16 and later  22 with otherwise negative vital signs.  Labs revealed hypokalemia 3.2 and CO2 of 19 with a blood Kos 167 and creatinine 1.42 total bili of 2.1.  CBC was unremarkable. EKG as reviewed by me : EKG showed sinus rhythm with a rate of 70 with slightly poor R wave progression and T wave inversion inferiorly. Imaging: None.  The patient was given 1 L bolus of IV normal saline.  He was still orthostatic.  He will be admitted to an observation medical telemetry bed for further evaluation and management. PAST MEDICAL HISTORY:   Past Medical History:  Diagnosis  Date  . Anxiety   . Back pain   . High cholesterol   . Non-alcoholic fatty liver disease   . Vertigo     PAST SURGICAL HISTORY:   Past Surgical History:  Procedure Laterality Date  . APPENDECTOMY      SOCIAL HISTORY:   Social History   Tobacco Use  . Smoking status: Never  . Smokeless tobacco: Never  Substance Use Topics  . Alcohol use: Yes    Comment: rarely    FAMILY HISTORY:   Family History  Problem Relation Age of Onset  . Suicidality Mother   . Cirrhosis Father     DRUG ALLERGIES:   Allergies  Allergen Reactions  . Ambien [Zolpidem]     Other reaction(s): Amnesia  . Effexor  Xr [Venlafaxine  Hcl]     Other reaction(s): Grinding teeth    REVIEW OF SYSTEMS:   ROS As per history of present illness. All pertinent systems were reviewed above. Constitutional, HEENT, cardiovascular, respiratory, GI, GU, musculoskeletal, neuro, psychiatric, endocrine, integumentary and hematologic systems were reviewed and are otherwise negative/unremarkable except for positive findings mentioned above in the HPI.   MEDICATIONS AT HOME:   Prior to Admission medications   Medication Sig Start Date End Date Taking? Authorizing Provider  atorvastatin  (LIPITOR) 40 MG tablet Take 20 mg by mouth daily.    [provider]  ciprofloxacin  (CIPRO ) 500 MG tablet Take 1  tablet (500 mg total) by mouth 2 (two) times daily. 06/15/22   Rodriguez-Southworth, Sylvia, PA-C  fenofibrate (TRICOR) 145 MG tablet Take 145 mg by mouth at bedtime.     [provider]  lamoTRIgine (LAMICTAL) 100 MG tablet Take 100 mg by mouth 2 (two) times daily. 02/13/21   [provider]  lurasidone  (LATUDA ) 40 MG TABS tablet Take 20 mg by mouth at bedtime.     [provider]  Melatonin 5 MG TABS Take 5 mg by mouth at bedtime as needed (sleep).    [provider]  Omega-3 Fatty Acids (FISH OIL) 1000 MG CAPS Take 2,000 mg by mouth daily.    [provider]   pantoprazole  (PROTONIX ) 40 MG tablet Take 1 tablet (40 mg total) by mouth daily. 03/08/20   Cook, Jayce G, DO  propranolol (INDERAL) 10 MG tablet Take 10 mg by mouth 3 (three) times daily.    [provider]  venlafaxine  XR (EFFEXOR -XR) 150 MG 24 hr capsule Take 300 mg by mouth daily with breakfast.    [provider]  lithium  carbonate 300 MG capsule Take 300 mg by mouth at bedtime.   03/08/20  [provider]      VITAL SIGNS:  Blood pressure 108/66, pulse 92, temperature 98.2 F (36.8 C), temperature source Oral, resp. rate 20, SpO2 100%.  PHYSICAL EXAMINATION:  Physical Exam  GENERAL:  38 y.o.-year-old Caucasian male patient lying in the bed with no acute distress.  EYES: Pupils equal, round, reactive to light and accommodation. No scleral icterus. Extraocular muscles intact.  HEENT: Head atraumatic, normocephalic. Oropharynx and nasopharynx clear.  NECK:  Supple, no jugular venous distention. No thyroid enlargement, no tenderness.  LUNGS: Normal breath sounds bilaterally, no wheezing, rales,rhonchi or crepitation. No use of accessory muscles of respiration.  CARDIOVASCULAR: Regular rate and rhythm, S1, S2 normal. No murmurs, rubs, or gallops.  ABDOMEN: Soft, nondistended, nontender. Bowel sounds present. No organomegaly or mass.  EXTREMITIES: No pedal edema, cyanosis, or clubbing.  NEUROLOGIC: Cranial nerves II through XII are intact. Muscle strength 5/5 in all extremities. Sensation intact. Gait not checked.  PSYCHIATRIC: The patient is alert and oriented x 3.  Normal affect and good eye contact. SKIN: No obvious rash, lesion, or ulcer.   LABORATORY PANEL:   CBC Recent Labs  Lab 03/25/24 1725  WBC 7.4  HGB 13.7  HCT 38.2*  PLT 175   ------------------------------------------------------------------------------------------------------------------  Chemistries  Recent Labs  Lab 03/25/24 1725  NA 137  K 3.2*  CL 104  CO2 19*  GLUCOSE 167*   BUN 14  CREATININE 1.42*  CALCIUM  9.2  AST 29  ALT 41  ALKPHOS 47  BILITOT 2.1*   ------------------------------------------------------------------------------------------------------------------  Cardiac Enzymes No results for input(s): "TROPONINI" in the last 168 hours. ------------------------------------------------------------------------------------------------------------------  RADIOLOGY:  No results found.    IMPRESSION AND PLAN:  Assessment and Plan: No notes have been filed under this hospital service. Service: Hospitalist      DVT prophylaxis: Lovenox ***  Advanced Care Planning:  Code Status: full code***  Family Communication:  The plan of care was discussed in details with the patient (and family). I answered all questions. The patient agreed to proceed with the above mentioned plan. Further management will depend upon hospital course. Disposition Plan: Back to previous home environment Consults called: none***  All the records are reviewed and case discussed with ED provider.  Status is: Observation {Observation:23811}   At the time of the admission, it appears that  the appropriate admission status for this patient is inpatient.  This is judged to be reasonable and necessary in order to provide the required intensity of service to ensure the patient's safety given the presenting symptoms, physical exam findings and initial radiographic and laboratory data in the context of comorbid conditions.  The patient requires inpatient status due to high intensity of service, high risk of further deterioration and high frequency of surveillance required.  I certify that at the time of admission, it is my clinical judgment that the patient will require inpatient hospital care extending more than 2 midnights.                            Dispo: The patient is from: Home              Anticipated d/c is to: Home              Patient currently is not medically stable to  d/c.              Difficult to place patient: No  Virgene Griffin M.D on 03/25/2024 at 10:29 PM  [facility]   From 7 PM-7 AM, contact night-coverage [email or web address]  CC: Primary care physician; [facility], [facility]

## 2024-03-25 NOTE — H&P (Addendum)
 [place]   PATIENT NAME: Tyler Pearson    MR#:  [id number]  DATE OF BIRTH:  1985-12-19  DATE OF ADMISSION:  03/25/2024  PRIMARY CARE PHYSICIAN: [facility]   Patient is coming from: Home  REQUESTING/REFERRING PHYSICIAN: Bryson Carbine, MD  CHIEF COMPLAINT:   Chief Complaint  Patient presents with   Loss of Consciousness    HISTORY OF PRESENT ILLNESS:  Kaushal Vannice is a 38 y.o. Caucasian male with medical history significant for anxiety, dyslipidemia and NASH, who presented to the emergency room with acute onset of recurrent syncope and near syncope.  Patient has been taking prazosin for PTSD with nightmares and does not increased the dose to 10 mg.  Today he was anxious and took Klonopin.  He stated that became flushed and diaphoretic.  He did lose consciousness briefly.  He stated that he did not have much appetite today but has been drinking water.  He has been having diminished urine output unlike his usual though.  He denies any fever or chills.  No dysuria, urinary frequency or urgency.  No hematuria or flank pain.  No cough or wheezing or dyspnea.  He admitted to mild chest tightness and palpitation when he is standing up from laying down or sitting position.  ED Course: When he came to the ER, BP was initially 134/78 and later 99/53 with heart rate of 92 and later 116 and respiratory rate of 16 and later  22 with otherwise negative vital signs.  Labs revealed hypokalemia 3.2 and CO2 of 19 with a blood Kos 167 and creatinine 1.42 total bili of 2.1.  CBC was unremarkable. EKG as reviewed by me : EKG showed sinus rhythm with a rate of 70 with slightly poor R wave progression and T wave inversion inferiorly. Imaging: None.  The patient was given 1 L bolus of IV normal saline.  He was still orthostatic.  He will be admitted to an observation medical telemetry bed for further evaluation and management. PAST MEDICAL HISTORY:   Past Medical History:  Diagnosis  Date   Anxiety    Back pain    High cholesterol    Non-alcoholic fatty liver disease    Vertigo     PAST SURGICAL HISTORY:   Past Surgical History:  Procedure Laterality Date   APPENDECTOMY      SOCIAL HISTORY:   Social History   Tobacco Use   Smoking status: Never   Smokeless tobacco: Never  Substance Use Topics   Alcohol use: Yes    Comment: rarely    FAMILY HISTORY:   Family History  Problem Relation Age of Onset   Suicidality Mother    Cirrhosis Father     DRUG ALLERGIES:   Allergies  Allergen Reactions   Ambien [Zolpidem]     Other reaction(s): Amnesia   Effexor  Xr [Venlafaxine  Hcl]     Other reaction(s): Grinding teeth    REVIEW OF SYSTEMS:   ROS As per history of present illness. All pertinent systems were reviewed above. Constitutional, HEENT, cardiovascular, respiratory, GI, GU, musculoskeletal, neuro, psychiatric, endocrine, integumentary and hematologic systems were reviewed and are otherwise negative/unremarkable except for positive findings mentioned above in the HPI.   MEDICATIONS AT HOME:   Prior to Admission medications   Medication Sig Start Date End Date Taking? Authorizing Provider  atorvastatin  (LIPITOR) 40 MG tablet Take 20 mg by mouth daily.    [provider]  ciprofloxacin  (CIPRO ) 500 MG tablet Take 1  tablet (500 mg total) by mouth 2 (two) times daily. 06/15/22   Rodriguez-Southworth, Sylvia, PA-C  fenofibrate  (TRICOR ) 145 MG tablet Take 145 mg by mouth at bedtime.     [provider]  lamoTRIgine  (LAMICTAL ) 100 MG tablet Take 100 mg by mouth 2 (two) times daily. 02/13/21   [provider]  lurasidone  (LATUDA ) 40 MG TABS tablet Take 20 mg by mouth at bedtime.     [provider]  Melatonin 5 MG TABS Take 5 mg by mouth at bedtime as needed (sleep).    [provider]  Omega-3 Fatty Acids (FISH OIL) 1000 MG CAPS Take 2,000 mg by mouth daily.    [provider]  pantoprazole   (PROTONIX ) 40 MG tablet Take 1 tablet (40 mg total) by mouth daily. 03/08/20   Cook, Jayce G, DO  propranolol  (INDERAL ) 10 MG tablet Take 10 mg by mouth 3 (three) times daily.    [provider]  venlafaxine  XR (EFFEXOR -XR) 150 MG 24 hr capsule Take 300 mg by mouth daily with breakfast.    [provider]  lithium  carbonate 300 MG capsule Take 300 mg by mouth at bedtime.   03/08/20  [provider]      VITAL SIGNS:  Blood pressure 94/66, pulse 68, temperature 98.4 F (36.9 C), temperature source Oral, resp. rate 16, height 5\' 9"  (1.753 m), weight 84.5 kg, SpO2 (!) 86%.  PHYSICAL EXAMINATION:  Physical Exam  GENERAL:  38 y.o.-year-old Caucasian male patient lying in the bed with no acute distress.  EYES: Pupils equal, round, reactive to light and accommodation. No scleral icterus. Extraocular muscles intact.  HEENT: Head atraumatic, normocephalic. Oropharynx and nasopharynx clear.  NECK:  Supple, no jugular venous distention. No thyroid enlargement, no tenderness.  LUNGS: Normal breath sounds bilaterally, no wheezing, rales,rhonchi or crepitation. No use of accessory muscles of respiration.  CARDIOVASCULAR: Regular rate and rhythm, S1, S2 normal. No murmurs, rubs, or gallops.  ABDOMEN: Soft, nondistended, nontender. Bowel sounds present. No organomegaly or mass.  EXTREMITIES: No pedal edema, cyanosis, or clubbing.  NEUROLOGIC: Cranial nerves II through XII are intact. Muscle strength 5/5 in all extremities. Sensation intact. Gait not checked.  PSYCHIATRIC: The patient is alert and oriented x 3.  Normal affect and good eye contact. SKIN: No obvious rash, lesion, or ulcer.   LABORATORY PANEL:   CBC Recent Labs  Lab 03/25/24 1725  WBC 7.4  HGB 13.7  HCT 38.2*  PLT 175   ------------------------------------------------------------------------------------------------------------------  Chemistries  Recent Labs  Lab 03/25/24 1725  NA 137  K 3.2*  CL 104   CO2 19*  GLUCOSE 167*  BUN 14  CREATININE 1.42*  CALCIUM  9.2  AST 29  ALT 41  ALKPHOS 47  BILITOT 2.1*   ------------------------------------------------------------------------------------------------------------------  Cardiac Enzymes No results for input(s): "TROPONINI" in the last 168 hours. ------------------------------------------------------------------------------------------------------------------  RADIOLOGY:  No results found.    IMPRESSION AND PLAN:  Assessment and Plan: * Recurrent syncope - The patient will be admitted to an observation medically monitored bed. - Will check orthostatics q 12 hours. - Will obtain a 2D echo. - The patient will be gently hydrated with IV normal saline and monitored for arrhythmias. -Differential diagnoses would include likely orthostatic hypotension  and other less likely : neurally mediated syncope, cardiogenic, arrhythmias related,  and  hypoglycemia.    PTSD (post-traumatic stress disorder) - Will hold off prazosin for now and obtain psychiatry consultation. - I notified Dr. Gaynell Keeler about the patient.  Dyslipidemia -  Will continue statin therapy and Tricor .  GERD without esophagitis - Will continue PPI therapy.  Major depression, chronic - Will continue Latuda , Effexor  XR and Lamictal .     DVT prophylaxis: Lovenox .  Advanced Care Planning:  Code Status: full code.  Family Communication:  The plan of care was discussed in details with the patient (and family). I answered all questions. The patient agreed to proceed with the above mentioned plan. Further management will depend upon hospital course. Disposition Plan: Back to previous home environment Consults called: Psychiatry All the records are reviewed and case discussed with ED provider.  Status is: Observation  I certify that at the time of admission, it is my clinical judgment that the patient will require  hospital care extending less than 2  midnights.                            Dispo: The patient is from: Home              Anticipated d/c is to: Home              Patient currently is not medically stable to d/c.              Difficult to place patient: No  Virgene Griffin M.D on 03/26/2024 at 5:14 AM  [facility]   From 7 PM-7 AM, contact night-coverage [email or web address]  CC: Primary care physician; [facility], [facility]

## 2024-03-25 NOTE — ED Notes (Addendum)
 While this RN in Rm pt started to c/o feeling lightheaded and like he was going to pass out. Pt visibly diaphoretic. BP taken. Reading 99/53. HR in the 120's currently 122. MD made aware.

## 2024-03-25 NOTE — ED Triage Notes (Signed)
 Pt to ed from home via home via [facility] for a syncope witnessed by his wife. Initial BP for FD on scene was 60/40 and for EMS was 80/60. Pt states he drinks about a gallon of water a day. Pt is very pale and yellow. Pt is caox4, in no acute distress but states he is very weak. Vitals for EMS  post 700 NS  140/80 85 HR  BGL 134 16G LAC

## 2024-03-26 ENCOUNTER — Encounter: Payer: Self-pay | Admitting: Family Medicine

## 2024-03-26 DIAGNOSIS — E785 Hyperlipidemia, unspecified: Secondary | ICD-10-CM

## 2024-03-26 DIAGNOSIS — R55 Syncope and collapse: Secondary | ICD-10-CM

## 2024-03-26 DIAGNOSIS — K219 Gastro-esophageal reflux disease without esophagitis: Secondary | ICD-10-CM

## 2024-03-26 DIAGNOSIS — F4310 Post-traumatic stress disorder, unspecified: Secondary | ICD-10-CM

## 2024-03-26 DIAGNOSIS — F329 Major depressive disorder, single episode, unspecified: Secondary | ICD-10-CM | POA: Insufficient documentation

## 2024-03-26 LAB — CBC
HCT: 36.9 % — ABNORMAL LOW (ref 39.0–52.0)
Hemoglobin: 12.7 g/dL — ABNORMAL LOW (ref 13.0–17.0)
MCH: 30.8 pg (ref 26.0–34.0)
MCHC: 34.4 g/dL (ref 30.0–36.0)
MCV: 89.3 fL (ref 80.0–100.0)
Platelets: 227 10*3/uL (ref 150–400)
RBC: 4.13 MIL/uL — ABNORMAL LOW (ref 4.22–5.81)
RDW: 12.1 % (ref 11.5–15.5)
WBC: 7.4 10*3/uL (ref 4.0–10.5)
nRBC: 0 % (ref 0.0–0.2)

## 2024-03-26 LAB — BASIC METABOLIC PANEL WITH GFR
Anion gap: 7 (ref 5–15)
BUN: 12 mg/dL (ref 6–20)
CO2: 25 mmol/L (ref 22–32)
Calcium: 8.9 mg/dL (ref 8.9–10.3)
Chloride: 106 mmol/L (ref 98–111)
Creatinine, Ser: 1.01 mg/dL (ref 0.61–1.24)
GFR, Estimated: >60 mL/min (ref >60)
Glucose, Bld: 104 mg/dL — ABNORMAL HIGH (ref 70–99)
Potassium: 3.5 mmol/L (ref 3.5–5.1)
Sodium: 138 mmol/L (ref 135–145)

## 2024-03-26 LAB — HIV ANTIBODY (ROUTINE TESTING W REFLEX): HIV Screen 4th Generation wRfx: NONREACTIVE

## 2024-03-26 LAB — GLUCOSE, CAPILLARY
Glucose-Capillary: 110 mg/dL — ABNORMAL HIGH (ref 70–99)
Glucose-Capillary: 98 mg/dL (ref 70–99)

## 2024-03-26 NOTE — Assessment & Plan Note (Signed)
-   Will continue Latuda , Effexor  XR and Lamictal .

## 2024-03-26 NOTE — Progress Notes (Signed)
 Patient meds did not give due to pharmacy  did not verify it ,

## 2024-03-26 NOTE — Assessment & Plan Note (Signed)
-   The patient will be admitted to an observation medically monitored bed. - Will check orthostatics q 12 hours. - Will obtain a 2D echo. - The patient will be gently hydrated with IV normal saline and monitored for arrhythmias. -Differential diagnoses would include likely orthostatic hypotension  and other less likely : neurally mediated syncope, cardiogenic, arrhythmias related,  and  hypoglycemia.

## 2024-03-26 NOTE — Discharge Summary (Signed)
 Physician Discharge Summary  Tyler Pearson QIO:[id number] DOB: 1986/04/01 DOA: 03/25/2024  PCP: [facility]  Admit date: 03/25/2024 Discharge date: 03/26/2024  Admitted From: Home Disposition:  Home  Recommendations for Outpatient Follow-up:  Follow up with PCP in 1-2 weeks   Home Health:No  Equipment/Devices:None   Discharge Condition:Stable  CODE STATUS:FULL  Diet recommendation: Reg  Brief/Interim Summary:  38 y.o. Caucasian male with medical history significant for anxiety, dyslipidemia and NASH, who presented to the emergency room with acute onset of recurrent syncope and near syncope.  Patient has been taking prazosin for PTSD with nightmares and does not increased the dose to 10 mg.  Today he was anxious and took Klonopin.  He stated that became flushed and diaphoretic.  He did lose consciousness briefly.  He stated that he did not have much appetite today but has been drinking water.  He has been having diminished urine output unlike his usual though.  He denies any fever or chills.  No dysuria, urinary frequency or urgency.  No hematuria or flank pain.  No cough or wheezing or dyspnea.  He admitted to mild chest tightness and palpitation when he is standing up from laying down or sitting position.   Patient was maintained on observation status.  No changes on telemetry.  Orthostatics negative at time of discharge.  Patient was on IV fluids.  Strongly suspect vasovagal event in the setting of multiple sedating medications, poor hydration, poor p.o. intake, increasing anxiety.  Blood pressure stable anxiety improved at time of discharge.  Lengthy conversation about presenting symptoms.  Very low suspicion for seizure, stroke, MI.  No further workup indicated at this time.  Patient states he will follow-up with his outpatient physicians for any recommended medication titration.  None made at time of discharge.    Discharge Diagnoses:  Principal Problem:   Recurrent  syncope Active Problems:   Dyslipidemia   PTSD (post-traumatic stress disorder)   GERD without esophagitis   Major depression, chronic  Syncope Possibly convulsive.  Strongly suspect vasovagal event.  Low suspicion for underlying seizure, stroke, MI.  Very low suspicion for cardiogenic syncope.  Stable for discharge home at this time.   Discharge Instructions  Discharge Instructions     Diet - low sodium heart healthy   Complete by: As directed    Increase activity slowly   Complete by: As directed       Allergies as of 03/26/2024       Reactions   Ambien [zolpidem]    Other reaction(s): Amnesia   Effexor  Xr [venlafaxine  Hcl]    Other reaction(s): Grinding teeth        Medication List     STOP taking these medications    ciprofloxacin  500 MG tablet Commonly known as: CIPRO    fenofibrate  145 MG tablet Commonly known as: TRICOR    Fish Oil 1000 MG Caps   lamoTRIgine  100 MG tablet Commonly known as: LAMICTAL    lurasidone  40 MG Tabs tablet Commonly known as: LATUDA    melatonin 5 MG Tabs   pantoprazole  40 MG tablet Commonly known as: Protonix    venlafaxine  XR 150 MG 24 hr capsule Commonly known as: EFFEXOR -XR       TAKE these medications    atorvastatin  40 MG tablet Commonly known as: LIPITOR Take 40 mg by mouth at bedtime.   busPIRone 10 MG tablet Commonly known as: BUSPAR Take 10 mg by mouth 2 (two) times daily.   celecoxib 100 MG capsule Commonly known as: CELEBREX  Take 100 mg by mouth 2 (two) times daily.   cholecalciferol 25 MCG (1000 UNIT) tablet Commonly known as: VITAMIN D3 Take 2,000 Units by mouth daily.   FLUoxetine 20 MG capsule Commonly known as: PROZAC Take 60 mg by mouth in the morning.   prazosin 5 MG capsule Commonly known as: MINIPRESS Take 10 mg by mouth at bedtime.   propranolol  80 MG tablet Commonly known as: INDERAL  Take 80 mg by mouth at bedtime.        Follow-up Information     [facility]  Follow up.   Specialty: General Practice Why: Office is closed today, patient has to make their own hospital follow up. Contact information: [address] [place] [address] [phone]                Allergies  Allergen Reactions   Ambien [Zolpidem]     Other reaction(s): Amnesia   Effexor  Xr [Venlafaxine  Hcl]     Other reaction(s): Grinding teeth    Consultations: None   Procedures/Studies: No results found.    Subjective: Seen and examined on the day of discharge.  Stable no distress.  Appropriate discharge home.  Discharge Exam: Vitals:   03/26/24 0754 03/26/24 0755  BP: 112/77 116/65  Pulse: 84 88  Resp: 14 14  Temp: 98.4 F (36.9 C) 98.6 F (37 C)  SpO2: 100% 100%   Vitals:   03/26/24 0511 03/26/24 0752 03/26/24 0754 03/26/24 0755  BP: 122/78 113/73 112/77 116/65  Pulse: (!) 105 67 84 88  Resp:  14 14 14   Temp: 98.4 F (36.9 C) 98.6 F (37 C) 98.4 F (36.9 C) 98.6 F (37 C)  TempSrc: Oral     SpO2: 100% 98% 100% 100%  Weight:      Height:        General: Pt is alert, awake, not in acute distress Cardiovascular: RRR, S1/S2 +, no rubs, no gallops Respiratory: CTA bilaterally, no wheezing, no rhonchi Abdominal: Soft, NT, ND, bowel sounds + Extremities: no edema, no cyanosis    The results of significant diagnostics from this hospitalization (including imaging, microbiology, ancillary and laboratory) are listed below for reference.     Microbiology: No results found for this or any previous visit (from the past 240 hours).   Labs: BNP (last 3 results) No results for input(s): "BNP" in the last 8760 hours. Basic Metabolic Panel: Recent Labs  Lab 03/25/24 1725 03/26/24 0524  NA 137 138  K 3.2* 3.5  CL 104 106  CO2 19* 25  GLUCOSE 167* 104*  BUN 14 12  CREATININE 1.42* 1.01  CALCIUM  9.2 8.9   Liver Function Tests: Recent Labs  Lab 03/25/24 1725  AST 29  ALT 41  ALKPHOS 47  BILITOT 2.1*  PROT 7.6  ALBUMIN 4.7   No  results for input(s): "LIPASE", "AMYLASE" in the last 168 hours. No results for input(s): "AMMONIA" in the last 168 hours. CBC: Recent Labs  Lab 03/25/24 1725 03/26/24 0524  WBC 7.4 7.4  HGB 13.7 12.7*  HCT 38.2* 36.9*  MCV 87.8 89.3  PLT 175 227   Cardiac Enzymes: No results for input(s): "CKTOTAL", "CKMB", "CKMBINDEX", "TROPONINI" in the last 168 hours. BNP: Invalid input(s): "POCBNP" CBG: Recent Labs  Lab 03/26/24 0549 03/26/24 0744  GLUCAP 110* 98   D-Dimer No results for input(s): "DDIMER" in the last 72 hours. Hgb A1c No results for input(s): "HGBA1C" in the last 72 hours. Lipid Profile No results for input(s): "  CHOL", "HDL", "LDLCALC", "TRIG", "CHOLHDL", "LDLDIRECT" in the last 72 hours. Thyroid function studies No results for input(s): "TSH", "T4TOTAL", "T3FREE", "THYROIDAB" in the last 72 hours.  Invalid input(s): "FREET3" Anemia work up No results for input(s): "VITAMINB12", "FOLATE", "FERRITIN", "TIBC", "IRON", "RETICCTPCT" in the last 72 hours. Urinalysis    Component Value Date/Time   COLORURINE YELLOW 06/15/2022 0847   APPEARANCEUR CLEAR 06/15/2022 0847   LABSPEC >1.030 (H) 06/15/2022 0847   PHURINE 5.5 06/15/2022 0847   GLUCOSEU NEGATIVE 06/15/2022 0847   HGBUR NEGATIVE 06/15/2022 0847   BILIRUBINUR SMALL (A) 06/15/2022 0847   KETONESUR TRACE (A) 06/15/2022 0847   PROTEINUR TRACE (A) 06/15/2022 0847   NITRITE NEGATIVE 06/15/2022 0847   LEUKOCYTESUR NEGATIVE 06/15/2022 0847   Sepsis Labs Recent Labs  Lab 03/25/24 1725 03/26/24 0524  WBC 7.4 7.4   Microbiology No results found for this or any previous visit (from the past 240 hours).   Time coordinating discharge: Over 30 minutes  SIGNED:   Tiajuana Fluke, MD  [facility] 03/26/2024, 10:47 AM Pager   If 7PM-7AM, please contact night-coverage

## 2024-03-26 NOTE — TOC Initial Note (Signed)
 Transition of Care [facility]) - Initial/Assessment Note    Patient Details  Name: Tyler Pearson MRN: [id number] Date of Birth: [birth year]  [facility]) CM/SW Contact:    Alexandra Ice, RN Phone Number: 03/26/2024, 8:41 AM  Clinical Narrative:                  Patient lives with spouse, Tyler Pearson. He is independent with ADLs. He goes to [facility] for primary care. No TOC needs identified at this time, will continue to follow.       Patient Goals and CMS Choice            Expected Discharge Plan and Services                                              Prior Living Arrangements/Services                       Activities of Daily Living   ADL Screening (condition at time of admission) Independently performs ADLs?: Yes (appropriate for developmental age) Is the patient deaf or have difficulty hearing?: No Does the patient have difficulty seeing, even when wearing glasses/contacts?: No Does the patient have difficulty concentrating, remembering, or making decisions?: No  Permission Sought/Granted                  Emotional Assessment              Admission diagnosis:  Orthostatic hypotension [I95.1] Near syncope [R55] Patient Active Problem List   Diagnosis Date Noted   Recurrent syncope 03/26/2024   Dyslipidemia 03/26/2024   GERD without esophagitis 03/26/2024   PTSD (post-traumatic stress disorder) 03/26/2024   Major depression, chronic 03/26/2024   Chest pain 05/11/2018   HLD (hyperlipidemia) 05/11/2018   Anxiety 05/11/2018   ACUTE PHARYNGITIS 04/28/2010   Headache 04/28/2010   NAUSEA ALONE 04/28/2010   BACK PAIN, THORACIC REGION 02/20/2009   LUMBAR STRAIN 02/20/2009   Contusion of back 02/20/2009   PCP:  [facility]:   [id number] [place], [place] - [address] AT [facility] [address] [place] [address] [place] [address] [place] [address] Phone: [phone] Fax:  [phone]     Social Drivers of Health (SDOH) Social History: SDOH Screenings   Food Insecurity: No Food Insecurity (03/26/2024)  Housing: Low Risk  (03/26/2024)  Transportation Needs: No Transportation Needs (03/26/2024)  Utilities: Not At Risk (03/26/2024)  Financial Resource Strain: Low Risk  (07/28/2022)   Received from [facility]  Tobacco Use: Low Risk  (03/26/2024)   SDOH Interventions:     Readmission Risk Interventions     No data to display

## 2024-03-26 NOTE — Progress Notes (Signed)
   03/26/24 0511  Vitals  Temp 98.4 F (36.9 C)  Temp Source Oral  BP 122/78  MAP (mmHg) 90  BP Location Right Arm  BP Method Automatic  Patient Position (if appropriate) Standing  Pulse Rate (!) 105  Pulse Rate Source Monitor  MEWS COLOR  MEWS Score Color Green  Oxygen Therapy  SpO2 100 %  MEWS Score  MEWS Temp 0  MEWS Systolic 0  MEWS Pulse 1  MEWS RR 0  MEWS LOC 0  MEWS Score 1

## 2024-03-26 NOTE — Plan of Care (Signed)

## 2024-03-26 NOTE — Assessment & Plan Note (Signed)
 Will continue PPI therapy.

## 2024-03-26 NOTE — Assessment & Plan Note (Addendum)
-   Will hold off prazosin for now and obtain psychiatry consultation. - I notified Dr. Gaynell Keeler about the patient.

## 2024-03-26 NOTE — Assessment & Plan Note (Addendum)
-   Will continue statin therapy and Tricor .

## 2024-03-26 NOTE — Progress Notes (Shared)
 Orthostatic vital   03/26/24 0511  Vitals  Temp 98.4 F (36.9 C)  Temp Source Oral  BP 122/78  MAP (mmHg) 90  BP Location Right Arm  BP Method Automatic  Patient Position (if appropriate) Standing  Pulse Rate (!) 105  Pulse Rate Source Monitor  MEWS COLOR  MEWS Score Color Green  Oxygen Therapy  SpO2 100 %  MEWS Score  MEWS Temp 0  MEWS Systolic 0  MEWS Pulse 1  MEWS RR 0  MEWS LOC 0  MEWS Score 1

## 2024-08-17 ENCOUNTER — Ambulatory Visit
Admission: EM | Admit: 2024-08-17 | Discharge: 2024-08-17 | Disposition: A | Discharge status: Home / Self Care | Attending: Emergency Medicine | Admitting: Emergency Medicine

## 2024-08-17 DIAGNOSIS — S61211A Laceration without foreign body of left index finger without damage to nail, initial encounter: Secondary | ICD-10-CM | POA: Diagnosis not present

## 2024-08-17 MED ORDER — CEPHALEXIN 500 MG PO CAPS: 500.0000 mg | ORAL_CAPSULE | Freq: Three times a day (TID) | ORAL | 0 refills | Status: AC

## 2024-08-17 NOTE — ED Provider Notes (Signed)
 [facility]    CSN: [id number] Arrival date & time: 08/17/24  1604      History   Chief Complaint Chief Complaint  Patient presents with   Laceration    HPI Tyler Pearson is a 38 y.o. male.   HPI  38 year old male with past medical history significant for NASH, vertigo, high cholesterol, back pain, and anxiety presents for evaluation of a laceration he sustained to his left index finger.  He reports that he was cutting chicken when he sustained a laceration.  She denies any numbness or tingling.  Last tetanus shot was 07/14/2023.  Past Medical History:  Diagnosis Date   Anxiety    Back pain    High cholesterol    Non-alcoholic fatty liver disease    Vertigo     Patient Active Problem List   Diagnosis Date Noted   Recurrent syncope 03/26/2024   Dyslipidemia 03/26/2024   GERD without esophagitis 03/26/2024   PTSD (post-traumatic stress disorder) 03/26/2024   Major depression, chronic 03/26/2024   Chest pain 05/11/2018   HLD (hyperlipidemia) 05/11/2018   Anxiety 05/11/2018   ACUTE PHARYNGITIS 04/28/2010   Headache 04/28/2010   NAUSEA ALONE 04/28/2010   BACK PAIN, THORACIC REGION 02/20/2009   LUMBAR STRAIN 02/20/2009   Contusion of back 02/20/2009    Past Surgical History:  Procedure Laterality Date   APPENDECTOMY         Home Medications    Prior to Admission medications   Medication Sig Start Date End Date Taking? Authorizing Provider  cephALEXin (KEFLEX) 500 MG capsule Take 1 capsule (500 mg total) by mouth 3 (three) times daily for 7 days. 08/17/24 08/24/24 Yes Bernardino Ditch, NP  atorvastatin  (LIPITOR) 40 MG tablet Take 40 mg by mouth at bedtime.    [provider]  busPIRone (BUSPAR) 10 MG tablet Take 10 mg by mouth 2 (two) times daily.    [provider]  celecoxib (CELEBREX) 100 MG capsule Take 100 mg by mouth 2 (two) times daily.    [provider]  cholecalciferol (VITAMIN D3) 25 MCG (1000 UNIT) tablet Take  2,000 Units by mouth daily.    [provider]  FLUoxetine (PROZAC) 20 MG capsule Take 60 mg by mouth in the morning.    [provider]  prazosin (MINIPRESS) 5 MG capsule Take 10 mg by mouth at bedtime.    [provider]  propranolol  (INDERAL ) 80 MG tablet Take 80 mg by mouth at bedtime.    [provider]  lithium  carbonate 300 MG capsule Take 300 mg by mouth at bedtime.   03/08/20  [provider]    Family History Family History  Problem Relation Age of Onset   Suicidality Mother    Cirrhosis Father     Social History Social History   Tobacco Use   Smoking status: Never   Smokeless tobacco: Never  Vaping Use   Vaping status: Never Used  Substance Use Topics   Alcohol use: Yes    Comment: rarely   Drug use: Never     Allergies   Effexor  xr [venlafaxine  hcl] and Zolpidem   Review of Systems Review of Systems  Skin:  Positive for wound.  Neurological:  Negative for weakness and numbness.     Physical Exam Triage Vital Signs ED Triage Vitals  Encounter Vitals Group     BP      Girls Systolic BP Percentile      Girls Diastolic BP Percentile  Boys Systolic BP Percentile      Boys Diastolic BP Percentile      Pulse      Resp      Temp      Temp src      SpO2      Weight      Height      Head Circumference      Peak Flow      Pain Score      Pain Loc      Pain Education      Exclude from Growth Chart    No data found.  Updated Vital Signs BP 135/89 (BP Location: Left Arm)   Pulse 82   Temp 99.5 F (37.5 C) (Oral)   Resp 16   Ht 5' 9 (1.753 m)   Wt 182 lb (82.6 kg)   SpO2 100%   BMI 26.88 kg/m   Visual Acuity Right Eye Distance:   Left Eye Distance:   Bilateral Distance:    Right Eye Near:   Left Eye Near:    Bilateral Near:     Physical Exam Vitals and nursing note reviewed.  Constitutional:      Appearance: Normal appearance. He is not ill-appearing.  HENT:     Head: Normocephalic  and atraumatic.  Musculoskeletal:        General: Signs of injury present.  Skin:    General: Skin is warm and dry.     Capillary Refill: Capillary refill takes less than 2 seconds.     Findings: No erythema.  Neurological:     General: No focal deficit present.     Mental Status: He is alert and oriented to person, place, and time.      UC Treatments / Results  Labs (all labs ordered are listed, but only abnormal results are displayed) Labs Reviewed - No data to display  EKG   Radiology No results found.  Procedures Procedures (including critical care time)  Medications Ordered in UC Medications - No data to display  Initial Impression / Assessment and Plan / UC Course  I have reviewed the triage vital signs and the nursing notes.  Pertinent labs & imaging results that were available during my care of the patient were reviewed by me and considered in my medical decision making (see chart for details).   Patient is a pleasant, nontoxic-appearing 38 year old male presenting for evaluation of a laceration to his left index finger.  As you been seen image above, the laceration is at the proximal lateral aspect of the distal phalanx of his left index finger.  There is some venous oozing from the wound.  Range of motion and sensation are intact.  The wound was cleansed with alcohol and anesthetized with 1 mL of 1% lidocaine without epi.  Once good anesthesia was achieved the wound was cleansed with Chlorex-A saline, hemostasis was obtained using a turnicot, and the wound was closed using 3 simple interrupted sutures of 5-0 Prolene.  The wound was again cleansed with chlorhexidine saline and dressed with bacitracin, nonadherent gauze, and Coban.  Slight venous oozing continued.  Patient tolerated procedure well.  I will discharge patient home on Keflex 500 mg 3 times daily to prevent infection.  Advised him to leave the sutures in place for the next 10 days.  He should leave the  dressing in place for the next 24 hours and then remove the dressing, wash the wound with warm water and soap, pat it dry, and  apply antibiotic ointment.  He should do this for the next 48 hours.  After that he can leave it open to air when at home and cover with a dry dressing when out in public.  Precautions reviewed.   Final Clinical Impressions(s) / UC Diagnoses   Final diagnoses:  Laceration of left index finger without foreign body without damage to nail, initial encounter     Discharge Instructions      Leave the dressing in place for the next 24 hours.  After 24 hours remove the dressing, wash the wound with warm water and soap, pat it dry, and apply a thin smear of bacitracin.  Clean the wound daily and apply bacitracin for the first 2 days.  After that a scab should have started to form and you can leave the wound open to air when you are at home and cover with a Band-Aid when you go out in public.  Take the Keflex thrice daily for 7 days for prevention of wound infection.  Sutures remain in place for 10 days, please return here or see your primary care provider for removal.  If you develop any redness at the wound site, swelling, pain, drainage, red streaks going up your hand, or fever please return for reevaluation.      ED Prescriptions     Medication Sig Dispense Auth. Provider   cephALEXin (KEFLEX) 500 MG capsule Take 1 capsule (500 mg total) by mouth 3 (three) times daily for 7 days. 21 capsule Bernardino Ditch, NP      PDMP not reviewed this encounter.   Bernardino Ditch, NP 08/17/24 [phone]

## 2024-08-17 NOTE — ED Triage Notes (Signed)
 Pt c/o LAC of L index finger that occurred today. States got cut with fillet knife. Last tetanus 07/14/2023.

## 2024-08-17 NOTE — Discharge Instructions (Addendum)
 Leave the dressing in place for the next 24 hours.  After 24 hours remove the dressing, wash the wound with warm water and soap, pat it dry, and apply a thin smear of bacitracin.  Clean the wound daily and apply bacitracin for the first 2 days.  After that a scab should have started to form and you can leave the wound open to air when you are at home and cover with a Band-Aid when you go out in public.  Take the Keflex thrice daily for 7 days for prevention of wound infection.  Sutures remain in place for 10 days, please return here or see your primary care provider for removal.  If you develop any redness at the wound site, swelling, pain, drainage, red streaks going up your hand, or fever please return for reevaluation.
